# Patient Record
Sex: Female | Born: 2000 | Race: White | Hispanic: No | Marital: Single | Smoking: Never smoker
Health system: Southern US, Community
[De-identification: ages and names within clinical notes are randomized; demographics above are authoritative.]

## PROBLEM LIST (undated history)

## (undated) DIAGNOSIS — F419 Anxiety disorder, unspecified: Secondary | ICD-10-CM

## (undated) DIAGNOSIS — F909 Attention-deficit hyperactivity disorder, unspecified type: Secondary | ICD-10-CM

## (undated) DIAGNOSIS — K829 Disease of gallbladder, unspecified: Secondary | ICD-10-CM

## (undated) DIAGNOSIS — M779 Enthesopathy, unspecified: Secondary | ICD-10-CM

## (undated) DIAGNOSIS — M222X9 Patellofemoral disorders, unspecified knee: Secondary | ICD-10-CM

## (undated) DIAGNOSIS — F32A Depression, unspecified: Secondary | ICD-10-CM

## (undated) DIAGNOSIS — K219 Gastro-esophageal reflux disease without esophagitis: Secondary | ICD-10-CM

## (undated) HISTORY — DX: Attention-deficit hyperactivity disorder, unspecified type: F90.9

## (undated) HISTORY — DX: Enthesopathy, unspecified: M77.9

## (undated) HISTORY — DX: Anxiety disorder, unspecified: F41.9

## (undated) HISTORY — DX: Gastro-esophageal reflux disease without esophagitis: K21.9

## (undated) HISTORY — PX: GALLBLADDER SURGERY: SHX652

## (undated) HISTORY — DX: Patellofemoral disorders, unspecified knee: M22.2X9

## (undated) HISTORY — DX: Depression, unspecified: F32.A

## (undated) HISTORY — DX: Disease of gallbladder, unspecified: K82.9

---
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
## 2018-05-11 IMAGING — US US ABDOMEN LIMITED
1 series · 14 of 25 positions shown · non-contrast
Comparison: 05/26/2012

CLINICAL DATA: Weight loss, RIGHT upper quadrant pain for 5 days

EXAM:
US ABDOMEN LIMITED - RIGHT UPPER QUADRANT

[Series 1: us abdomen limited · 0.26mm/px · 14 of 53 slices shown]
[im 1/53]
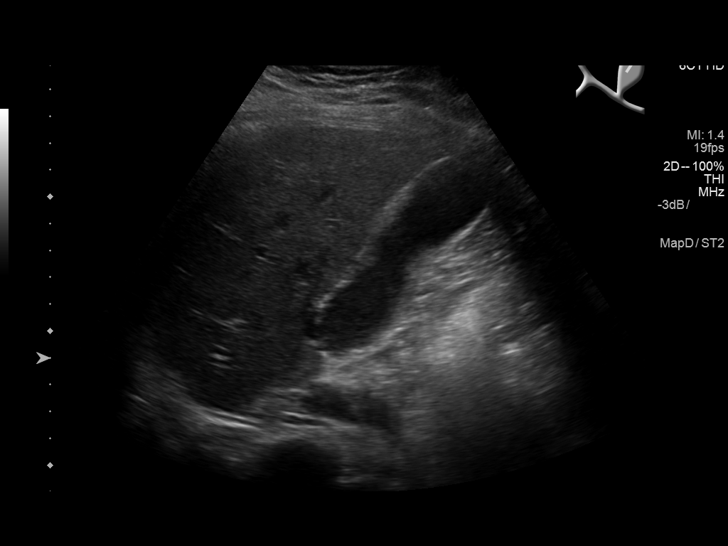
[im 5/53]
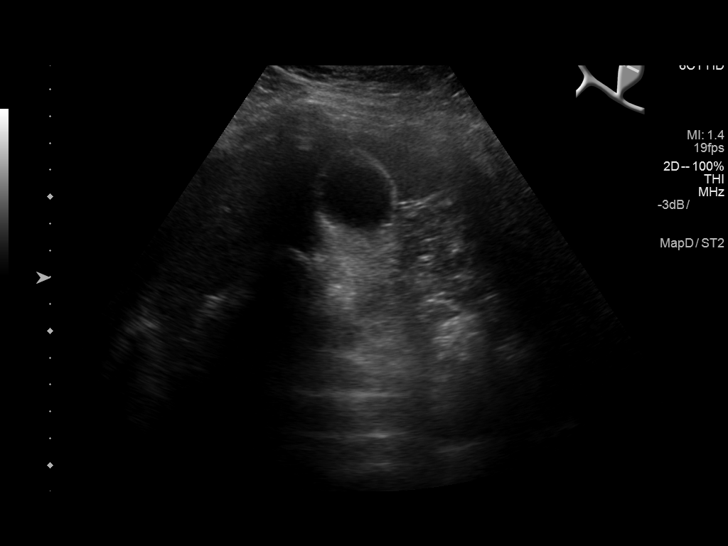
[im 9/53]
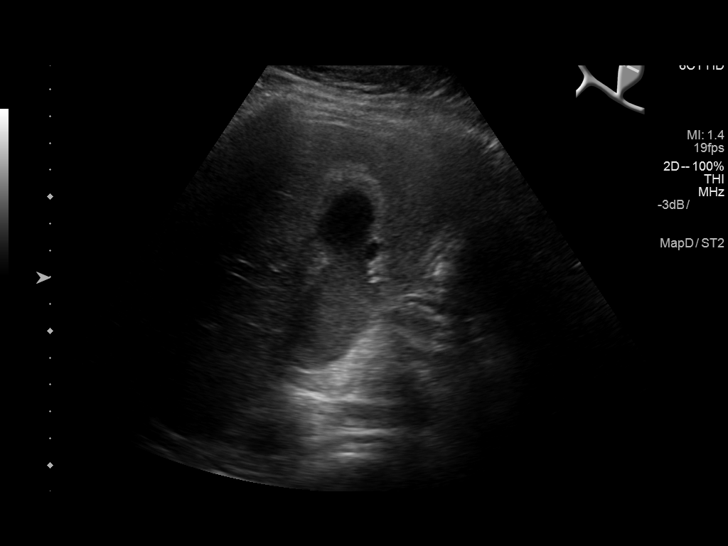
[im 14/53]
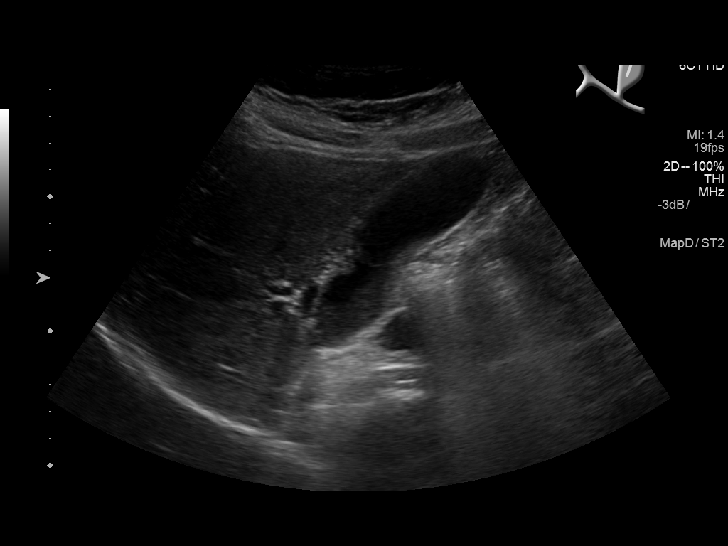
[im 18/53]
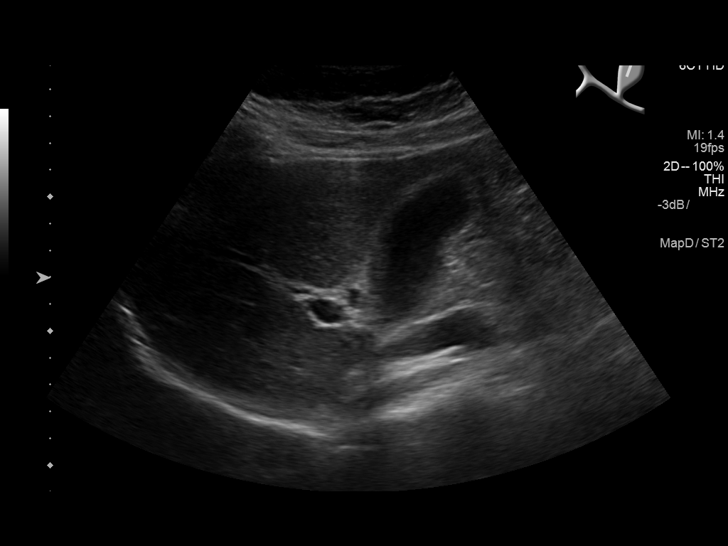
[im 20/53]
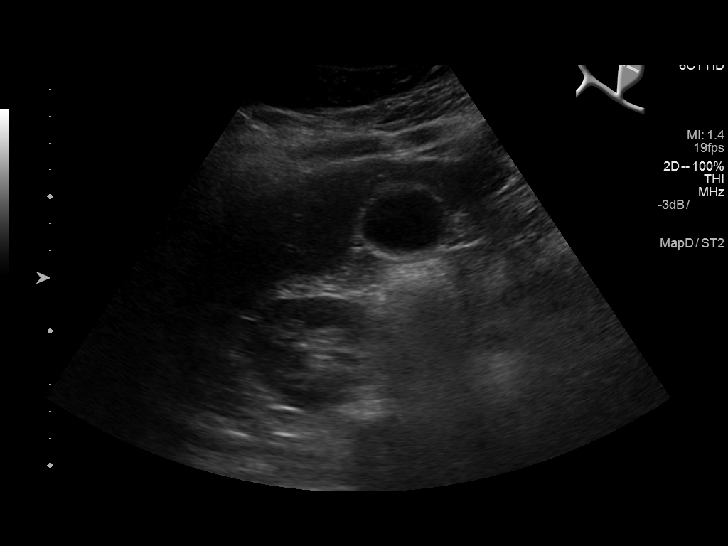
[im 24/53]
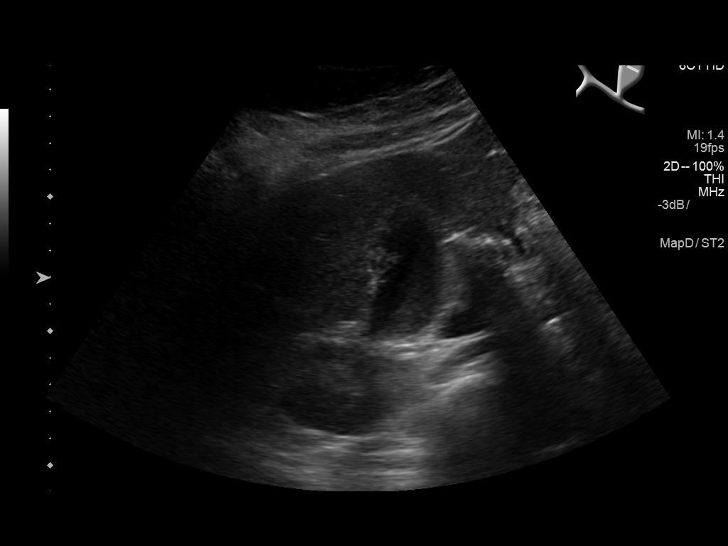
[im 29/53]
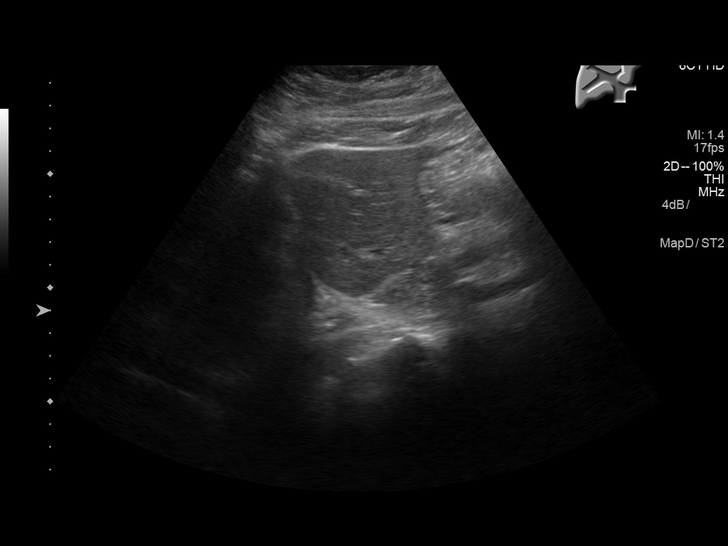
[im 33/53]
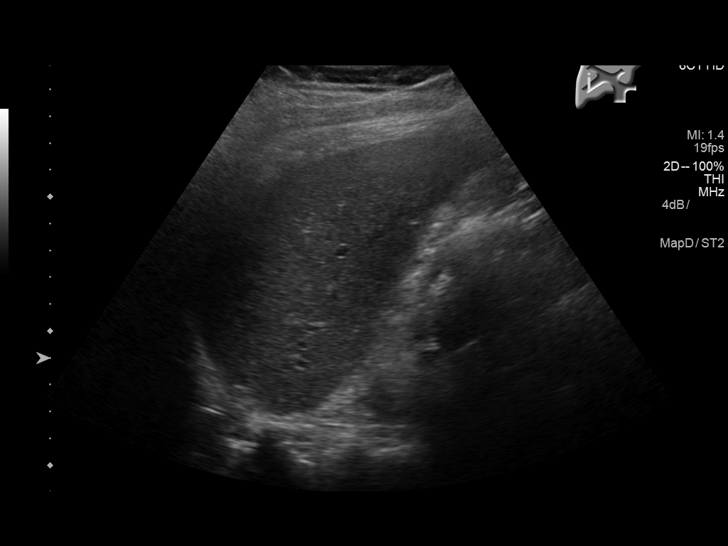
[im 35/53]
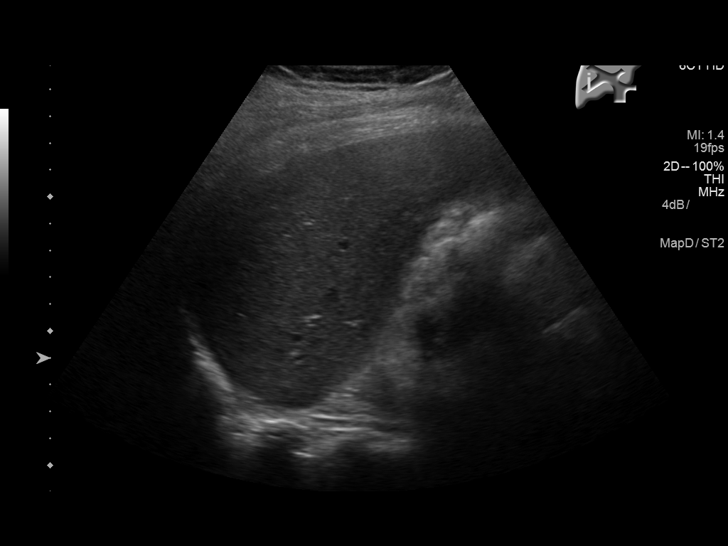
[im 40/53]
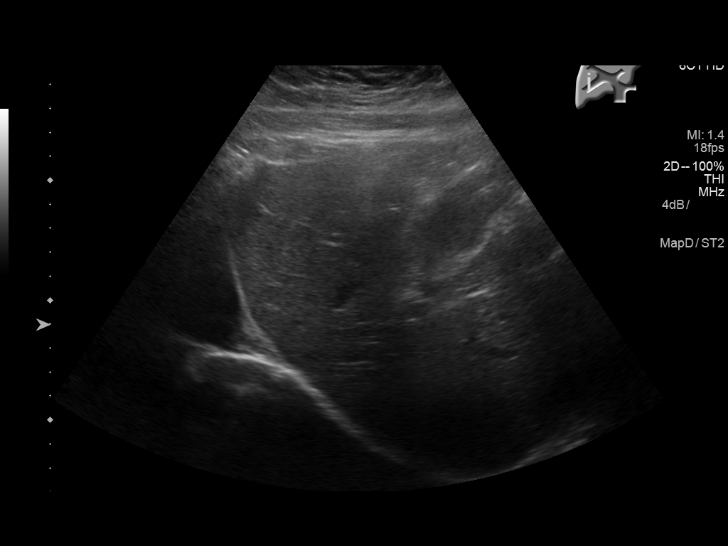
[im 44/53]
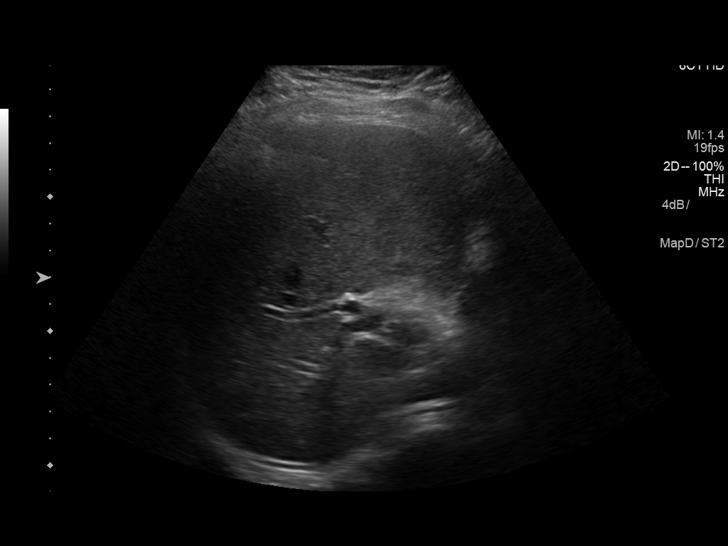
[im 48/53]
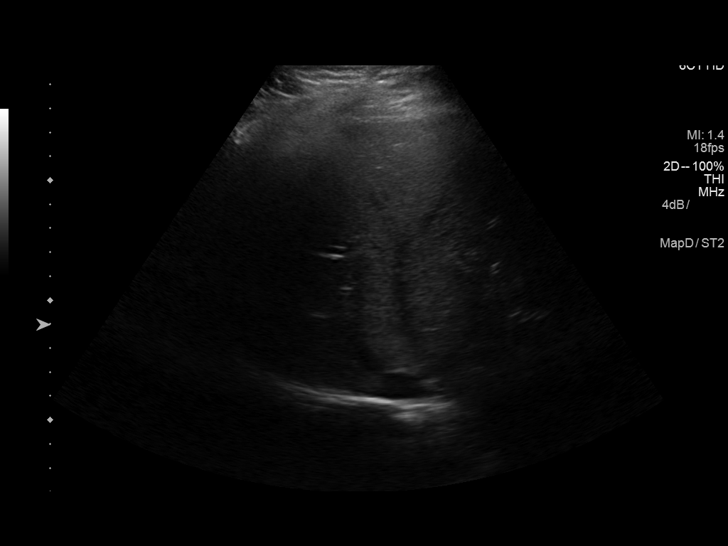
[im 53/53]
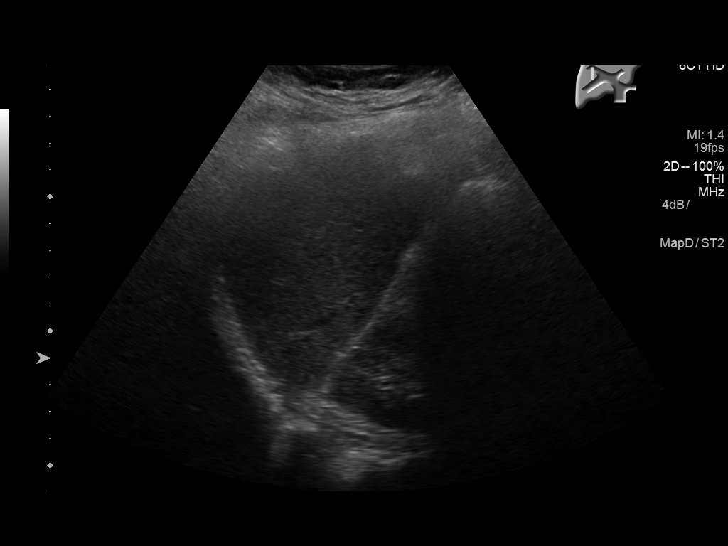

[14 of 25 positions shown; findings below may reference images not displayed]

FINDINGS: Gallbladder:

Dependent sludge within gallbladder. No shadowing gallstones. Mild
gallbladder wall thickening present. No pericholecystic fluid or
sonographic Murphy sign.

Common bile duct:

Diameter: 3 mm diameter , normal

Liver:

Normal appearance.  Hepatopetal portal venous flow.

No RIGHT upper quadrant free fluid
IMPRESSION: Sludge within gallbladder with mild nonspecific gallbladder wall
thickening.

No definite evidence of gallstones or biliary dilatation.

## 2018-12-04 ENCOUNTER — Telehealth: Payer: Self-pay | Admitting: Obstetrics and Gynecology

## 2018-12-04 NOTE — Telephone Encounter (Signed)
Patient is schedule for nexplanon replacement on 12/16/18 with [facility]

## 2018-12-07 NOTE — Telephone Encounter (Signed)
Noted. Will order to arrive by apt date/time. 

## 2018-12-16 ENCOUNTER — Encounter: Payer: Self-pay | Admitting: Obstetrics and Gynecology

## 2018-12-16 ENCOUNTER — Other Ambulatory Visit: Payer: Self-pay

## 2018-12-16 ENCOUNTER — Other Ambulatory Visit (HOSPITAL_COMMUNITY)
Admission: RE | Admit: 2018-12-16 | Discharge: 2018-12-16 | Disposition: A | Discharge status: Home / Self Care | Payer: Managed Care, Other (non HMO) | Source: Ambulatory Visit | Attending: Obstetrics and Gynecology | Admitting: Obstetrics and Gynecology

## 2018-12-16 ENCOUNTER — Ambulatory Visit (INDEPENDENT_AMBULATORY_CARE_PROVIDER_SITE_OTHER): Payer: Managed Care, Other (non HMO) | Admitting: Obstetrics and Gynecology

## 2018-12-16 VITALS — BP 124/80 | Ht 65.0 in | Wt 183.0 lb

## 2018-12-16 DIAGNOSIS — Z30017 Encounter for initial prescription of implantable subdermal contraceptive: Secondary | ICD-10-CM

## 2018-12-16 DIAGNOSIS — Z01419 Encounter for gynecological examination (general) (routine) without abnormal findings: Secondary | ICD-10-CM

## 2018-12-16 DIAGNOSIS — Z0000 Encounter for general adult medical examination without abnormal findings: Secondary | ICD-10-CM

## 2018-12-16 DIAGNOSIS — Z3046 Encounter for surveillance of implantable subdermal contraceptive: Secondary | ICD-10-CM

## 2018-12-16 DIAGNOSIS — Z113 Encounter for screening for infections with a predominantly sexual mode of transmission: Secondary | ICD-10-CM | POA: Diagnosis present

## 2018-12-16 NOTE — Progress Notes (Signed)
Gynecology Annual Exam  PCP: Pricilla HolmSharpe, Leslie M, MD  Chief Complaint:  Chief Complaint  Patient presents with  . Contraception    nexplanon replacement   . Annual Exam    History of Present Illness: Patient is a 18 y.o. G0P0000 presents for annual exam. The patient has no complaints today.   LMP: Patient's last menstrual period was 11/26/2018. Menarche:not applicable Average Interval: irregular, not applicable days Duration of flow: several days Heavy Menses: no Clots: no Intermenstrual Bleeding: no Postcoital Bleeding: no Dysmenorrhea: no  The patient is sexually active. She currently uses Nexplanon for contraception. She denies dyspareunia.  The patient does not perform self breast exams.  There is no notable family history of breast or ovarian cancer in her family.  The patient wears seatbelts: yes.  The patient has regular exercise: yes.    The patient denies current symptoms of depression.    Review of Systems: Review of Systems  Constitutional: Negative for chills, fever, malaise/fatigue and weight loss.  HENT: Negative for congestion, hearing loss and sinus pain.   Eyes: Negative for blurred vision and double vision.  Respiratory: Negative for cough, sputum production, shortness of breath and wheezing.   Cardiovascular: Negative for chest pain, palpitations, orthopnea and leg swelling.  Gastrointestinal: Negative for abdominal pain, constipation, diarrhea, nausea and vomiting.  Genitourinary: Negative for dysuria, flank pain, frequency, hematuria and urgency.  Musculoskeletal: Negative for back pain, falls and joint pain.  Skin: Negative for itching and rash.  Neurological: Negative for dizziness and headaches.  Psychiatric/Behavioral: Negative for depression, substance abuse and suicidal ideas. The patient is not nervous/anxious.     Past Medical History:  Past Medical History:  Diagnosis Date  . Tendonitis     Past Surgical History:  Past Surgical  History:  Procedure Laterality Date  . GALLBLADDER SURGERY      Gynecologic History:  Patient's last menstrual period was 11/26/2018. Contraception: Nexplanon Last Pap: Under 21  Obstetric History: G0P0000  Family History:  Family History  Adopted: Yes    Social History:  Social History   Socioeconomic History  . Marital status: Single    Spouse name: Not on file  . Number of children: Not on file  . Years of education: Not on file  . Highest education level: Not on file  Occupational History  . Not on file  Social Needs  . Financial resource strain: Not on file  . Food insecurity    Worry: Not on file    Inability: Not on file  . Transportation needs    Medical: Not on file    Non-medical: Not on file  Tobacco Use  . Smoking status: Never Smoker  . Smokeless tobacco: Never Used  Substance and Sexual Activity  . Alcohol use: Never    Frequency: Never  . Drug use: Never  . Sexual activity: Yes    Birth control/protection: Implant  Lifestyle  . Physical activity    Days per week: Not on file    Minutes per session: Not on file  . Stress: Not on file  Relationships  . Social Musicianconnections    Talks on phone: Not on file    Gets together: Not on file    Attends religious service: Not on file    Active member of club or organization: Not on file    Attends meetings of clubs or organizations: Not on file    Relationship status: Not on file  . Intimate partner violence  Fear of current or ex partner: Not on file    Emotionally abused: Not on file    Physically abused: Not on file    Forced sexual activity: Not on file  Other Topics Concern  . Not on file  Social History Narrative  . Not on file    Allergies:  No Known Allergies  Medications: Prior to Admission medications   Medication Sig Start Date End Date Taking? Authorizing Provider  etonogestrel (NEXPLANON) 68 MG IMPL implant 68 mg.   Yes [provider]  meloxicam (MOBIC) 15 MG tablet  Take 15 mg by mouth daily.   Yes [provider]    Physical Exam Vitals: Blood pressure 124/80, height 5\' 5"  (1.651 m), weight 183 lb (83 kg), last menstrual period 11/26/2018.  General: NAD HEENT: normocephalic, anicteric Thyroid: no enlargement, no palpable nodules Pulmonary: No increased work of breathing, CTAB Cardiovascular: RRR, distal pulses 2+ Breast: DECLINED Abdomen: NABS, soft, non-tender, non-distended.  Umbilicus without lesions.  No hepatomegaly, splenomegaly or masses palpable. No evidence of hernia  Genitourinary: DECLINED Extremities: no edema, erythema, or tenderness Neurologic: Grossly intact Psychiatric: mood appropriate, affect full  Female chaperone present for pelvic and breast  portions of the physical exam   Nexplanon removal and insertion Procedure note - The Nexplanon was noted in the patient's arm and the end was identified. The skin was cleansed with a Betadine solution. A small injection of subcutaneous lidocaine with epinephrine 3 cc was given over the end of the implant. An incision was made at the end of the implant. The rod was noted in the incision and grasped with a hemostat. It was noted to be intact.  Steri-Strip was placed approximating the incision. Hemostasis was noted.  Patient given informed consent, signed copy in the chart, time out was performed.Appropriate time out taken.  Patient's right arm was prepped and draped in the usual sterile fashion.. The ruler used to measure and mark insertion area.  Pt was prepped with betadine swab and then injected with 3 cc of 2% lidocaine with epinephrine. Nexplanon removed form packaging,  Device confirmed in needle, then inserted full length of needle and withdrawn per handbook instructions.  Pt insertion site covered with steri-strip and a bandage.   Minimal blood loss.  Pt tolerated the procedure well.    Assessment: 18 y.o. G0P0000 routine annual exam  Plan: Problem List Items Addressed This  Visit    None    Visit Diagnoses    Screening examination for STD (sexually transmitted disease)    -  Primary   Relevant Orders   Urine cytology ancillary only   Health care maintenance       Encounter for Nexplanon removal       Nexplanon insertion          1) 4) Gardasil Series discussed and if applicable offered to patient - Patient has previously completed 3 shot series   2) STI screening  was offered and accepted  3)  ASCCP guidelines and rational discussed.  Patient opts for initiating at 21 screening interval  4) Contraception - the patient is currently using  Nexplanon.  She is happy with her current form of contraception and plans to continue. Nexplanon removed and replaced today.  We discussed safe sex practices to reduce her furture risk of STI's.    5) Return in about 1 year (around 12/16/2019).  Adrian Prows MD [facility], [facility] 12/16/2018 11:21 AM

## 2018-12-18 LAB — URINE CYTOLOGY ANCILLARY ONLY
Chlamydia: NEGATIVE
Neisseria Gonorrhea: NEGATIVE
Trichomonas: NEGATIVE

## 2019-07-26 NOTE — Telephone Encounter (Signed)
Nexplanon provided for this patient. Nexplanon rcvd/charged 12/16/2018

## 2021-02-06 ENCOUNTER — Other Ambulatory Visit: Payer: Self-pay

## 2021-02-06 NOTE — Progress Notes (Deleted)
   New Patient Office Visit  Subjective:  Patient ID: Bailey Serrano, female    DOB: 05-Sep-2000  Age: 20 y.o. MRN: [id number]  CC: No chief complaint on file.   HPI Bailey Serrano presents for ***  Past Medical History:  Diagnosis Date   Tendonitis     Past Surgical History:  Procedure Laterality Date   GALLBLADDER SURGERY      Family History  Adopted: Yes    Social History   Socioeconomic History   Marital status: Single    Spouse name: Not on file   Number of children: Not on file   Years of education: Not on file   Highest education level: Not on file  Occupational History   Not on file  Tobacco Use   Smoking status: Never   Smokeless tobacco: Never  Vaping Use   Vaping Use: Every day  Substance and Sexual Activity   Alcohol use: Never   Drug use: Never   Sexual activity: Yes    Birth control/protection: Implant  Other Topics Concern   Not on file  Social History Narrative   Not on file   Social Determinants of Health   Financial Resource Strain: Not on file  Food Insecurity: Not on file  Transportation Needs: Not on file  Physical Activity: Not on file  Stress: Not on file  Social Connections: Not on file  Intimate Partner Violence: Not on file    ROS Review of Systems  Constitutional:  Negative for chills, fatigue and fever.  HENT:  Negative for congestion, ear pain, rhinorrhea and sore throat.   Respiratory:  Negative for cough and shortness of breath.   Cardiovascular:  Negative for chest pain.  Gastrointestinal:  Negative for abdominal pain, constipation, diarrhea, nausea and vomiting.  Genitourinary:  Negative for dysuria and urgency.  Musculoskeletal:  Negative for back pain and myalgias.  Neurological:  Negative for dizziness, weakness, light-headedness and headaches.  Psychiatric/Behavioral:  Negative for dysphoric mood. The patient is not nervous/anxious.    Objective:   Today's Vitals: There were no vitals taken for this  visit.  Physical Exam Vitals reviewed.  Constitutional:      Appearance: Normal appearance. She is normal weight.  Neck:     Vascular: No carotid bruit.  Cardiovascular:     Rate and Rhythm: Normal rate and regular rhythm.     Pulses: Normal pulses.     Heart sounds: Normal heart sounds.  Pulmonary:     Effort: Pulmonary effort is normal. No respiratory distress.     Breath sounds: Normal breath sounds.  Abdominal:     General: Abdomen is flat. Bowel sounds are normal.     Palpations: Abdomen is soft.     Tenderness: There is no abdominal tenderness.  Neurological:     Mental Status: She is alert and oriented to person, place, and time.  Psychiatric:        Mood and Affect: Mood normal.        Behavior: Behavior normal.    Assessment & Plan:   Problem List Items Addressed This Visit   None   Outpatient Encounter Medications as of 02/07/2021  Medication Sig   etonogestrel (NEXPLANON) 68 MG IMPL implant 68 mg.   meloxicam (MOBIC) 15 MG tablet Take 15 mg by mouth daily.   No facility-administered encounter medications on file as of 02/07/2021.    Follow-up: No follow-ups on file.   Horald Pollen, CMA

## 2021-02-07 ENCOUNTER — Encounter: Payer: Self-pay | Admitting: Nurse Practitioner

## 2021-02-07 ENCOUNTER — Ambulatory Visit: Payer: Managed Care, Other (non HMO) | Admitting: Nurse Practitioner

## 2021-02-07 VITALS — BP 112/62 | HR 109 | Temp 97.5°F | Ht 65.5 in | Wt 206.0 lb

## 2021-02-07 DIAGNOSIS — R000 Tachycardia, unspecified: Secondary | ICD-10-CM

## 2021-02-07 DIAGNOSIS — Z021 Encounter for pre-employment examination: Secondary | ICD-10-CM | POA: Diagnosis not present

## 2021-02-07 DIAGNOSIS — Z0000 Encounter for general adult medical examination without abnormal findings: Secondary | ICD-10-CM

## 2021-02-07 NOTE — Patient Instructions (Addendum)
Return for weight management appointment We will call you with lab results    Preventive Care 74-22 Years Old, Female Preventive care refers to lifestyle choices and visits with your health care provider that can promote health and wellness. This includes: A yearly physical exam. This is also called an annual wellness visit. Regular dental and eye exams. Immunizations. Screening for certain conditions. Healthy lifestyle choices, such as: Eating a healthy diet. Getting regular exercise. Not using drugs or products that contain nicotine and tobacco. Limiting alcohol use. What can I expect for my preventive care visit? Physical exam Your health care provider may check your: Height and weight. These may be used to calculate your BMI (body mass index). BMI is a measurement that tells if you are at a healthy weight. Heart rate and blood pressure. Body temperature. Skin for abnormal spots. Counseling Your health care provider may ask you questions about your: Past medical problems. Family's medical history. Alcohol, tobacco, and drug use. Emotional well-being. Home life and relationship well-being. Sexual activity. Diet, exercise, and sleep habits. Work and work Statistician. Access to firearms. Method of birth control. Menstrual cycle. Pregnancy history. What immunizations do I need? Vaccines are usually given at various ages, according to a schedule. Your health care provider will recommend vaccines for you based on your age, medical history, and lifestyle or other factors, such as travel or where you work. What tests do I need? Blood tests Lipid and cholesterol levels. These may be checked every 5 years starting at age 28. Hepatitis C test. Hepatitis B test. Screening Diabetes screening. This is done by checking your blood sugar (glucose) after you have not eaten for a while (fasting). STD (sexually transmitted disease) testing, if you are at risk. BRCA-related cancer  screening. This may be done if you have a family history of breast, ovarian, tubal, or peritoneal cancers. Pelvic exam and Pap test. This may be done every 3 years starting at age 68. Starting at age 24, this may be done every 5 years if you have a Pap test in combination with an HPV test. Talk with your health care provider about your test results, treatment options, and if necessary, the need for more tests. Follow these instructions at home: Eating and drinking  Eat a healthy diet that includes fresh fruits and vegetables, whole grains, lean protein, and low-fat dairy products. Take vitamin and mineral supplements as recommended by your health care provider. Do not drink alcohol if: Your health care provider tells you not to drink. You are pregnant, may be pregnant, or are planning to become pregnant. If you drink alcohol: Limit how much you have to 0-1 drink a day. Be aware of how much alcohol is in your drink. In the [place] one drink equals one 12 oz bottle of beer (355 mL), one 5 oz glass of wine (148 mL), or one 1 oz glass of hard liquor (44 mL). Lifestyle Take daily care of your teeth and gums. Brush your teeth every morning and night with fluoride toothpaste. Floss one time each day. Stay active. Exercise for at least 30 minutes 5 or more days each week. Do not use any products that contain nicotine or tobacco, such as cigarettes, e-cigarettes, and chewing tobacco. If you need help quitting, ask your health care provider. Do not use drugs. If you are sexually active, practice safe sex. Use a condom or other form of protection to prevent STIs (sexually transmitted infections). If you do not wish to become pregnant, use a  form of birth control. If you plan to become pregnant, see your health care provider for a prepregnancy visit. Find healthy ways to cope with stress, such as: Meditation, yoga, or listening to music. Journaling. Talking to a trusted person. Spending time with friends  and family. Safety Always wear your seat belt while driving or riding in a vehicle. Do not drive: If you have been drinking alcohol. Do not ride with someone who has been drinking. When you are tired or distracted. While texting. Wear a helmet and other protective equipment during sports activities. If you have firearms in your house, make sure you follow all gun safety procedures. Seek help if you have been physically or sexually abused. What's next? Go to your health care provider once a year for an annual wellness visit. Ask your health care provider how often you should have your eyes and teeth checked. Stay up to date on all vaccines. This information is not intended to replace advice given to you by your health care provider. Make sure you discuss any questions you have with your health care provider. Document Revised: 06/30/2020 Document Reviewed: 01/01/2018 Elsevier Patient Education  [place] Maintenance, Female Adopting a healthy lifestyle and getting preventive care are important in promoting health and wellness. Ask your health care provider about: The right schedule for you to have regular tests and exams. Things you can do on your own to prevent diseases and keep yourself healthy. What should I know about diet, weight, and exercise? Eat a healthy diet  Eat a diet that includes plenty of vegetables, fruits, low-fat dairy products, and lean protein. Do not eat a lot of foods that are high in solid fats, added sugars, or sodium. Maintain a healthy weight Body mass index (BMI) is used to identify weight problems. It estimates body fat based on height and weight. Your health care provider can help determine your BMI and help you achieve or maintain a healthy weight. Get regular exercise Get regular exercise. This is one of the most important things you can do for your health. Most adults should: Exercise for at least 150 minutes each week. The exercise should  increase your heart rate and make you sweat (moderate-intensity exercise). Do strengthening exercises at least twice a week. This is in addition to the moderate-intensity exercise. Spend less time sitting. Even light physical activity can be beneficial. Watch cholesterol and blood lipids Have your blood tested for lipids and cholesterol at 22 years of age, then have this test every 5 years. Have your cholesterol levels checked more often if: Your lipid or cholesterol levels are high. You are older than 22 years of age. You are at high risk for heart disease. What should I know about cancer screening? Depending on your health history and family history, you may need to have cancer screening at various ages. This may include screening for: Breast cancer. Cervical cancer. Colorectal cancer. Skin cancer. Lung cancer. What should I know about heart disease, diabetes, and high blood pressure? Blood pressure and heart disease High blood pressure causes heart disease and increases the risk of stroke. This is more likely to develop in people who have high blood pressure readings, are of African descent, or are overweight. Have your blood pressure checked: Every 3-5 years if you are 88-86 years of age. Every year if you are 34 years old or older. Diabetes Have regular diabetes screenings. This checks your fasting blood sugar level. Have the screening done: Once every three years  after age 37 if you are at a normal weight and have a low risk for diabetes. More often and at a younger age if you are overweight or have a high risk for diabetes. What should I know about preventing infection? Hepatitis B If you have a higher risk for hepatitis B, you should be screened for this virus. Talk with your health care provider to find out if you are at risk for hepatitis B infection. Hepatitis C Testing is recommended for: Everyone born from [birth year] through 1965. Anyone with known risk factors for hepatitis  C. Sexually transmitted infections (STIs) Get screened for STIs, including gonorrhea and chlamydia, if: You are sexually active and are younger than 22 years of age. You are older than 22 years of age and your health care provider tells you that you are at risk for this type of infection. Your sexual activity has changed since you were last screened, and you are at increased risk for chlamydia or gonorrhea. Ask your health care provider if you are at risk. Ask your health care provider about whether you are at high risk for HIV. Your health care provider may recommend a prescription medicine to help prevent HIV infection. If you choose to take medicine to prevent HIV, you should first get tested for HIV. You should then be tested every 3 months for as long as you are taking the medicine. Pregnancy If you are about to stop having your period (premenopausal) and you may become pregnant, seek counseling before you get pregnant. Take 400 to 800 micrograms (mcg) of folic acid every day if you become pregnant. Ask for birth control (contraception) if you want to prevent pregnancy. Osteoporosis and menopause Osteoporosis is a disease in which the bones lose minerals and strength with aging. This can result in bone fractures. If you are 87 years old or older, or if you are at risk for osteoporosis and fractures, ask your health care provider if you should: Be screened for bone loss. Take a calcium or vitamin D supplement to lower your risk of fractures. Be given hormone replacement therapy (HRT) to treat symptoms of menopause. Follow these instructions at home: Lifestyle Do not use any products that contain nicotine or tobacco, such as cigarettes, e-cigarettes, and chewing tobacco. If you need help quitting, ask your health care provider. Do not use street drugs. Do not share needles. Ask your health care provider for help if you need support or information about quitting drugs. Alcohol use Do not  drink alcohol if: Your health care provider tells you not to drink. You are pregnant, may be pregnant, or are planning to become pregnant. If you drink alcohol: Limit how much you use to 0-1 drink a day. Limit intake if you are breastfeeding. Be aware of how much alcohol is in your drink. In the [place] one drink equals one 12 oz bottle of beer (355 mL), one 5 oz glass of wine (148 mL), or one 1 oz glass of hard liquor (44 mL). General instructions Schedule regular health, dental, and eye exams. Stay current with your vaccines. Tell your health care provider if: You often feel depressed. You have ever been abused or do not feel safe at home. Summary Adopting a healthy lifestyle and getting preventive care are important in promoting health and wellness. Follow your health care provider's instructions about healthy diet, exercising, and getting tested or screened for diseases. Follow your health care provider's instructions on monitoring your cholesterol and blood pressure. This information is  not intended to replace advice given to you by your health care provider. Make sure you discuss any questions you have with your health care provider. Document Revised: 06/30/2020 Document Reviewed: 04/15/2018 Elsevier Patient Education  2022 [employer].

## 2021-02-07 NOTE — Progress Notes (Signed)
Subjective:  Patient ID: Bailey Serrano, female    DOB: Feb 05, 2001  Age: 20 y.o. 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 This is her initial visit to the office. She is accompanied by her significant other.   Well Adult Physical: Patient here for a comprehensive physical exam.The patient reports no problems Do you take any herbs or supplements that were not prescribed by a doctor? no Are you taking calcium supplements? no Are you taking aspirin daily? no  Encounter for general adult medical examination without abnormal findings  Physical ("At Risk" items are starred): Patient's last physical exam was 1 year ago .  Patient is not afflicted from Stress Incontinence and Urge Incontinence  Patient wears a seat belt, has smoke detectors, has carbon monoxide detectors, practices appropriate gun safety, and wears sunscreen with extended sun exposure. Dental Care: biannual cleanings, brushes and flosses daily. Ophthalmology/Optometry: Does not have annual visit.  Hearing loss: none Vision impairments: none  Menarche: 20 y.o Menstrual History: irregular LMP: 02/04/2021 Pregnancy history: No Safe at home: yes Self breast exams: yes  Flowsheet Row Office Visit from 02/07/2021 in [facility]  PHQ-2 Total Score 0               Social Hx   Social History   Socioeconomic History   Marital status: Single    Spouse name: Not on file   Number of children: Not on file   Years of education: Not on file   Highest education level: Not on file  Occupational History   Not on file  Tobacco Use   Smoking status: Never   Smokeless tobacco: Never  Vaping Use   Vaping Use: Every day  Substance and Sexual Activity   Alcohol use: Never   Drug use: Never   Sexual activity: Yes    Birth  control/protection: Implant  Other Topics Concern   Not on file  Social History Narrative   Not on file   Social Determinants of Health   Financial Resource Strain: Not on file  Food Insecurity: Not on file  Transportation Needs: Not on file  Physical Activity: Not on file  Stress: Not on file  Social Connections: Not on file   Past Medical History:  Diagnosis Date   Tendonitis    Past Surgical History:  Procedure Laterality Date   GALLBLADDER SURGERY      Family History  Adopted: Yes    Review of Systems  Constitutional:  Negative for appetite change, fatigue and unexpected weight change.  HENT:  Negative for congestion, ear pain, rhinorrhea, sinus pressure, sinus pain and tinnitus.   Eyes:  Negative for pain.  Respiratory:  Negative for cough and shortness of breath.   Cardiovascular:  Negative for chest pain, palpitations and leg swelling.  Gastrointestinal:  Negative for abdominal pain, constipation, diarrhea, nausea and vomiting.  Endocrine: Negative for cold intolerance, heat intolerance, polydipsia, polyphagia and polyuria.  Genitourinary:  Negative for dysuria, frequency and hematuria.  Musculoskeletal:  Negative for arthralgias, back pain, joint swelling and myalgias.  Skin:  Negative for rash.  Allergic/Immunologic: Negative for environmental allergies.  Neurological:  Negative for dizziness and headaches.  Hematological:  Negative for adenopathy.  Psychiatric/Behavioral:  Negative for decreased concentration and sleep disturbance. The patient is not nervous/anxious.  Objective:  Ht 5' 5.5" (1.664 m)   Wt 206 lb (93.4 kg)   BMI 33.76 kg/m   BP/Weight 02/07/2021 12/16/2018 12/15/2017  Systolic BP - 124 110  Diastolic BP - 80 70  Wt. (Lbs) 206 183 183  BMI 33.76 30.45 29.99  BP 112/62   Pulse (!) 109   Temp (!) 97.5 F (36.4 C)   Ht 5' 5.5" (1.664 m)   Wt 206 lb (93.4 kg)   LMP 02/04/2021   SpO2 100%   BMI 33.76 kg/m    Physical Exam Vitals  reviewed.  Constitutional:      Appearance: Normal appearance.  HENT:     Head: Normocephalic.     Right Ear: Tympanic membrane normal.     Left Ear: Tympanic membrane normal.     Nose: Nose normal.     Mouth/Throat:     Mouth: Mucous membranes are moist.  Eyes:     Pupils: Pupils are equal, round, and reactive to light.  Cardiovascular:     Rate and Rhythm: Regular rhythm. Tachycardia present.     Pulses: Normal pulses.     Heart sounds: Normal heart sounds.  Pulmonary:     Effort: Pulmonary effort is normal.     Breath sounds: Normal breath sounds.  Abdominal:     General: Bowel sounds are normal.     Palpations: Abdomen is soft.  Musculoskeletal:        General: Normal range of motion.     Cervical back: Neck supple.  Skin:    General: Skin is warm and dry.     Capillary Refill: Capillary refill takes less than 2 seconds.  Neurological:     General: No focal deficit present.     Mental Status: She is alert and oriented to person, place, and time.  Psychiatric:        Mood and Affect: Mood normal.        Behavior: Behavior normal.    Lab Results  Component Value Date   WBC 18.6 (H) 05/26/2012   HGB 15.6 05/26/2012   HCT 45.5 05/26/2012   PLT 265 05/26/2012   GLUCOSE 177 (H) 05/26/2012   ALT 22 05/26/2012   AST 24 05/26/2012   NA 141 05/26/2012   K 3.5 05/26/2012   CL 106 05/26/2012   CREATININE 0.80 05/26/2012   BUN 15 05/26/2012   CO2 27 (H) 05/26/2012      Assessment & Plan:   1. Encounter for pre-employment health screening examination - CBC with Differential/Platelet - Comprehensive metabolic panel - TSH  2. Tachycardia - CBC with Differential/Platelet - Comprehensive metabolic panel - TSH     Body mass index is 33.76 kg/m.   These are the goals we discussed:  Goals   Weight loss       This is a list of the screening recommended for you and due dates:  Health Maintenance  Topic Date Due   HPV Vaccine (1 - 2-dose series) Never done    HIV Screening  Never done   Hepatitis C Screening: USPSTF Recommendation to screen - Ages 5-79 yo.  Never done   Tetanus Vaccine  Never done   Chlamydia screening  12/16/2019   Pap Smear  Never done   Pap Smear  Never done   Flu Shot  Discontinued   COVID-19 Vaccine  Discontinued    Return for weight management appointment We will call you with lab results   Follow-up: 4-weeks, weight management   An  After Visit Summary was printed and given to the patient.  I, Janie Morning, NP, have reviewed all documentation for this visit. The documentation on 02/12/21 for the exam, diagnosis, procedures, and orders are all accurate and complete.   Janie Morning, NP [facility] [phone]

## 2021-02-08 LAB — COMPREHENSIVE METABOLIC PANEL
ALT: 20 IU/L (ref 0–32)
AST: 19 IU/L (ref 0–40)
Albumin/Globulin Ratio: 1.6 (ref 1.2–2.2)
Albumin: 4.5 g/dL (ref 3.9–5.0)
Alkaline Phosphatase: 77 IU/L (ref 44–121)
BUN/Creatinine Ratio: 11 (ref 9–23)
BUN: 8 mg/dL (ref 6–20)
Bilirubin Total: <0.2 mg/dL (ref 0.0–1.2)
CO2: 19 mmol/L — ABNORMAL LOW (ref 20–29)
Calcium: 9.9 mg/dL (ref 8.7–10.2)
Chloride: 104 mmol/L (ref 96–106)
Creatinine, Ser: 0.71 mg/dL (ref 0.57–1.00)
Globulin, Total: 2.9 g/dL (ref 1.5–4.5)
Glucose: 79 mg/dL (ref 70–99)
Potassium: 4.4 mmol/L (ref 3.5–5.2)
Sodium: 142 mmol/L (ref 134–144)
Total Protein: 7.4 g/dL (ref 6.0–8.5)
eGFR: 123 mL/min/{1.73_m2} (ref >59)

## 2021-02-08 LAB — CBC WITH DIFFERENTIAL/PLATELET
Basophils Absolute: 0.1 10*3/uL (ref 0.0–0.2)
Basos: 0 %
EOS (ABSOLUTE): 0 10*3/uL (ref 0.0–0.4)
Eos: 0 %
Hematocrit: 46.7 % — ABNORMAL HIGH (ref 34.0–46.6)
Hemoglobin: 15.5 g/dL (ref 11.1–15.9)
Immature Grans (Abs): 0.1 10*3/uL (ref 0.0–0.1)
Immature Granulocytes: 1 %
Lymphocytes Absolute: 2.7 10*3/uL (ref 0.7–3.1)
Lymphs: 24 %
MCH: 28.9 pg (ref 26.6–33.0)
MCHC: 33.2 g/dL (ref 31.5–35.7)
MCV: 87 fL (ref 79–97)
Monocytes Absolute: 0.6 10*3/uL (ref 0.1–0.9)
Monocytes: 5 %
Neutrophils Absolute: 7.9 10*3/uL — ABNORMAL HIGH (ref 1.4–7.0)
Neutrophils: 70 %
Platelets: 337 10*3/uL (ref 150–450)
RBC: 5.36 x10E6/uL — ABNORMAL HIGH (ref 3.77–5.28)
RDW: 13.1 % (ref 11.7–15.4)
WBC: 11.3 10*3/uL — ABNORMAL HIGH (ref 3.4–10.8)

## 2021-02-08 LAB — TSH: TSH: 1.55 u[IU]/mL (ref 0.450–4.500)

## 2021-03-06 NOTE — Progress Notes (Signed)
Subjective:  Patient ID: Bailey Serrano, female    DOB: 2001/01/15  Age: 20 y.o. MRN: [id number]  Chief Complaint  Patient presents with   Weight Loss Management    HPI  Bailey Serrano is a 20 year old Caucasian female that presents to discuss weight management. Current BMI 32.42. States she would like to lose weight to improve her overall health. She was adopted, does not know her family history of medical problems. She tells me that she gains weight easily and struggles to lose weight. States she eats a heart healthy diet and walks 2-3 miles daily. Drinks mostly water, an occasional Pharmacologist drink. She was an athlete in high school and college.She has difficulty doing aggressive cardio exercises due to sports injury to knee.  Current Outpatient Medications on File Prior to Visit  Medication Sig Dispense Refill   etonogestrel (NEXPLANON) 68 MG IMPL implant 68 mg.     No current facility-administered medications on file prior to visit.   Past Medical History:  Diagnosis Date   Patellofemoral syndrome    Tendonitis    Past Surgical History:  Procedure Laterality Date   GALLBLADDER SURGERY      Family History  Adopted: Yes  Problem Relation Age of Onset   Hypertension Mother    Muscular dystrophy Brother    COPD Maternal Grandmother    Diabetes Other    Aneurysm Other    Social History   Socioeconomic History   Marital status: Single    Spouse name: Not on file   Number of children: Not on file   Years of education: Not on file   Highest education level: Not on file  Occupational History   Not on file  Tobacco Use   Smoking status: Never   Smokeless tobacco: Never  Vaping Use   Vaping Use: Every day   Substances: Nicotine, Flavoring  Substance and Sexual Activity   Alcohol use: Never   Drug use: Never   Sexual activity: Yes    Birth control/protection: Implant  Other Topics Concern   Not on file  Social History Narrative   Not on file   Social  Determinants of Health   Financial Resource Strain: Not on file  Food Insecurity: Not on file  Transportation Needs: Not on file  Physical Activity: Not on file  Stress: Not on file  Social Connections: Not on file    Review of Systems  Constitutional:  Negative for chills, fatigue and fever.  HENT:  Negative for congestion, ear pain, rhinorrhea and sore throat.   Respiratory:  Negative for cough and shortness of breath.   Cardiovascular:  Negative for chest pain (Sometimes).  Gastrointestinal:  Negative for abdominal pain, constipation, diarrhea, nausea and vomiting.  Endocrine: Positive for polydipsia. Negative for polyphagia.  Genitourinary:  Negative for dysuria and urgency.  Musculoskeletal:  Negative for back pain and myalgias.  Neurological:  Positive for headaches. Negative for dizziness, weakness and light-headedness.  Psychiatric/Behavioral:  Negative for dysphoric mood. The patient is not nervous/anxious.     Objective:  BP 120/64   Pulse 86   Temp (!) 97.3 F (36.3 C)   Ht 5\' 7"  (1.702 m)   Wt 207 lb (93.9 kg)   SpO2 99%   BMI 32.42 kg/m    BP/Weight 02/07/2021 12/16/2018 12/15/2017  Systolic BP 112 124 110  Diastolic BP 62 80 70  Wt. (Lbs) 206 183 183  BMI 33.76 30.45 29.99    Physical Exam Vitals reviewed.  Constitutional:  Appearance: Normal appearance. She is normal weight.  Neck:     Vascular: No carotid bruit.  Cardiovascular:     Rate and Rhythm: Normal rate and regular rhythm.     Pulses: Normal pulses.     Heart sounds: Normal heart sounds.  Pulmonary:     Effort: Pulmonary effort is normal. No respiratory distress.     Breath sounds: Normal breath sounds.  Abdominal:     General: Abdomen is flat. Bowel sounds are normal.     Palpations: Abdomen is soft.     Tenderness: There is no abdominal tenderness.  Neurological:     Mental Status: She is alert and oriented to person, place, and time.  Psychiatric:        Mood and Affect: Mood  normal.        Behavior: Behavior normal.        Lab Results  Component Value Date   WBC 11.3 (H) 02/07/2021   HGB 15.5 02/07/2021   HCT 46.7 (H) 02/07/2021   PLT 337 02/07/2021   GLUCOSE 79 02/07/2021   ALT 20 02/07/2021   AST 19 02/07/2021   NA 142 02/07/2021   K 4.4 02/07/2021   CL 104 02/07/2021   CREATININE 0.71 02/07/2021   BUN 8 02/07/2021   CO2 19 (L) 02/07/2021   TSH 1.550 02/07/2021      Assessment & Plan:   .1. Encounter for weight management - phentermine (ADIPEX-P) 37.5 MG tablet; Take 1 tablet (37.5 mg total) by mouth daily before breakfast.  Dispense: 30 tablet; Refill: 0  2. BMI 32.0-32.9,adult - phentermine (ADIPEX-P) 37.5 MG tablet; Take 1 tablet (37.5 mg total) by mouth daily before breakfast.  Dispense: 30 tablet; Refill: 0  3. Obesity (BMI 30.0-34.9) - phentermine (ADIPEX-P) 37.5 MG tablet; Take 1 tablet (37.5 mg total) by mouth daily before breakfast.  Dispense: 30 tablet; Refill: 0    Begin Adipex 37.5 mg daily for weight management, start with 1/2 tablet for the first week and then increase to whole tablet Notify office if you have any adverse side effects Avoid caffeine while taking Adipex Eat small frequent meals with high fiber Follow-up in 4-weeks      Follow-up: 4-weeks  An After Visit Summary was printed and given to the patient.  I, Janie Morning, NP, have reviewed all documentation for this visit. The documentation on 03/07/21 for the exam, diagnosis, procedures, and orders are all accurate and complete.   Janie Morning, NP [facility] [phone]

## 2021-03-07 ENCOUNTER — Ambulatory Visit: Payer: Managed Care, Other (non HMO) | Admitting: Nurse Practitioner

## 2021-03-07 ENCOUNTER — Encounter: Payer: Self-pay | Admitting: Nurse Practitioner

## 2021-03-07 ENCOUNTER — Other Ambulatory Visit: Payer: Self-pay

## 2021-03-07 VITALS — BP 120/64 | HR 86 | Temp 97.3°F | Ht 67.0 in | Wt 207.0 lb

## 2021-03-07 DIAGNOSIS — E669 Obesity, unspecified: Secondary | ICD-10-CM

## 2021-03-07 DIAGNOSIS — Z7689 Persons encountering health services in other specified circumstances: Secondary | ICD-10-CM

## 2021-03-07 DIAGNOSIS — Z6832 Body mass index (BMI) 32.0-32.9, adult: Secondary | ICD-10-CM | POA: Diagnosis not present

## 2021-03-07 MED ORDER — PHENTERMINE HCL 37.5 MG PO TABS: 37.5000 mg | ORAL_TABLET | Freq: Every day | ORAL | 0 refills | Status: DC

## 2021-03-07 NOTE — Patient Instructions (Signed)
Begin Adipex 37.5 mg daily for weight management, start with 1/2 tablet for the first week and then increase to whole tablet Notify office if you have any adverse side effects Avoid caffeine while taking Adipex Eat small frequent meals with high fiber Follow-up in 4-weeks   How to Increase Your Level of Physical Activity Getting regular physical activity is important for your overall health and well-being. Most people do not get enough exercise. There are easy ways to increase your level of physical activity, even if you have not been very active in the past or if you are just starting out. What are the benefits of physical activity? Physical activity has many short-term and long-term benefits. Being active on a regular basis can improve your physical and mental health as well as provide other benefits. Physical health benefits Helping you lose weight or maintain a healthy weight. Strengthening your muscles and bones. Reducing your risk of certain long-term (chronic) diseases, including heart disease, cancer, and diabetes. Being able to move around more easily and for longer periods of time without getting tired (increased endurance or stamina). Improving your ability to fight off illness (enhanced immunity). Being able to sleep better. Helping you stay healthy as you get older, including: Helping you stay mobile, or capable of walking and moving around. Preventing accidents, such as falls. Increasing life expectancy. Mental health benefits Boosting your mood and improving your self-esteem. Lowering your chance of having mental health problems, such as depression or anxiety. Helping you feel good about your body. Other benefits Finding new sources of fun and enjoyment. Meeting new people who share a common interest. Before you begin If you have a chronic illness or have not been active for a while, check with your health care provider about how to get started. Ask your health care  provider what activities are safe for you. Start out slowly. Walking or doing some simple chair exercises is a good place to start, especially if you have not been active before or for a long time. Set goals that you can work toward. Ask your health care provider how much exercise is best for you. In general, most adults should: Do moderate-intensity exercise for at least 150 minutes each week (30 minutes on most days of the week) or vigorous exercise for at least 75 minutes each week, or a combination of these. Moderate-intensity exercise can include walking at a quick pace, biking, yoga, water aerobics, or gardening. Vigorous exercise involves activities that take more effort, such as jogging or running, playing sports, swimming laps, or jumping rope. Do strength exercises on at least 2 days each week. This can include weight lifting, body weight exercises, and resistance-band exercises. How to be more physically active Make a plan  Try to find activities that you enjoy. You are more likely to commit to an exercise routine if it does not feel like a chore. If you have bone or joint problems, choose low-impact exercises, like walking or swimming. Use these tips for being successful with an exercise plan: Find a workout partner for accountability. Join a group or class, such as an aerobics class, cycling class, or sports team. Make family time active. Go for a walk, bike, or swim. Include a variety of exercises each week. Consider using a fitness tracker, such as a mobile phone app or a device worn like a watch, that will count the number of steps you take each day. Many people strive to reach 10,000 steps a day. Find ways to be  active in your daily routines Besides your formal exercise plans, you can find ways to do physical activity during your daily routines, such as: Walking or biking to work or to the store. Taking the stairs instead of the elevator. Parking farther away from the door at  work or at the store. Planning walking meetings. Walking around while you are on the phone. Where to find more information Centers for Disease Control and Prevention: CampusCasting.com.pt President's Council on Fitness, Sports & Nutrition: www.fitness.gov ChooseMyPlate: http://www.harvey.com/ Contact a health care provider if: You have headaches, muscle aches, or joint pain that is concerning. You feel dizzy or light-headed while exercising. You faint. You feel your heart skipping, racing, or fluttering. You have chest pain while exercising. Summary Exercise benefits your mind and body at any age, even if you are just starting out. If you have a chronic illness or have not been active for a while, check with your health care provider before increasing your physical activity. Choose activities that are safe and enjoyable for you. Ask your health care provider what activities are safe for you. Start slowly. Tell your health care provider if you have problems as you start to increase your activity level. This information is not intended to replace advice given to you by your health care provider. Make sure you discuss any questions you have with your health care provider. Document Revised: 08/18/2020 Document Reviewed: 08/18/2020 Elsevier Patient Education  2022 [employer] Calorie Counting for [employer] Loss Calories are units of energy. Your body needs a certain number of calories from food to keep going throughout the day. When you eat or drink more calories than your body needs, your body stores the extra calories mostly as fat. When you eat or drink fewer calories than your body needs, your body burns fat to get the energy it needs. Calorie counting means keeping track of how many calories you eat and drink each day. Calorie counting can be helpful if you need to lose weight. If you eat fewer calories than your body needs, you should lose weight. Ask your health care provider what a healthy  weight is for you. For calorie counting to work, you will need to eat the right number of calories each day to lose a healthy amount of weight per week. A dietitian can help you figure out how many calories you need in a day and will suggest ways to reach your calorie goal. A healthy amount of weight to lose each week is usually 1-2 lb (0.5-0.9 kg). This usually means that your daily calorie intake should be reduced by 500-750 calories. Eating 1,200-1,500 calories a day can help most women lose weight. Eating 1,500-1,800 calories a day can help most men lose weight. What do I need to know about calorie counting? Work with your health care provider or dietitian to determine how many calories you should get each day. To meet your daily calorie goal, you will need to: Find out how many calories are in each food that you would like to eat. Try to do this before you eat. Decide how much of the food you plan to eat. Keep a food log. Do this by writing down what you ate and how many calories it had. To successfully lose weight, it is important to balance calorie counting with a healthy lifestyle that includes regular activity. Where do I find calorie information? The number of calories in a food can be found on a Nutrition Facts label. If a food does  not have a Nutrition Facts label, try to look up the calories online or ask your dietitian for help. Remember that calories are listed per serving. If you choose to have more than one serving of a food, you will have to multiply the calories per serving by the number of servings you plan to eat. For example, the label on a package of bread might say that a serving size is 1 slice and that there are 90 calories in a serving. If you eat 1 slice, you will have eaten 90 calories. If you eat 2 slices, you will have eaten 180 calories. How do I keep a food log? After each time that you eat, record the following in your food log as soon as possible: What you ate. Be  sure to include toppings, sauces, and other extras on the food. How much you ate. This can be measured in cups, ounces, or number of items. How many calories were in each food and drink. The total number of calories in the food you ate. Keep your food log near you, such as in a pocket-sized notebook or on an app or website on your mobile phone. Some programs will calculate calories for you and show you how many calories you have left to meet your daily goal. What are some portion-control tips? Know how many calories are in a serving. This will help you know how many servings you can have of a certain food. Use a measuring cup to measure serving sizes. You could also try weighing out portions on a kitchen scale. With time, you will be able to estimate serving sizes for some foods. Take time to put servings of different foods on your favorite plates or in your favorite bowls and cups so you know what a serving looks like. Try not to eat straight from a food's packaging, such as from a bag or box. Eating straight from the package makes it hard to see how much you are eating and can lead to overeating. Put the amount you would like to eat in a cup or on a plate to make sure you are eating the right portion. Use smaller plates, glasses, and bowls for smaller portions and to prevent overeating. Try not to multitask. For example, avoid watching TV or using your computer while eating. If it is time to eat, sit down at a table and enjoy your food. This will help you recognize when you are full. It will also help you be more mindful of what and how much you are eating. What are tips for following this plan? Reading food labels Check the calorie count compared with the serving size. The serving size may be smaller than what you are used to eating. Check the source of the calories. Try to choose foods that are high in protein, fiber, and vitamins, and low in saturated fat, trans fat, and sodium. Shopping Read  nutrition labels while you shop. This will help you make healthy decisions about which foods to buy. Pay attention to nutrition labels for low-fat or fat-free foods. These foods sometimes have the same number of calories or more calories than the full-fat versions. They also often have added sugar, starch, or salt to make up for flavor that was removed with the fat. Make a grocery list of lower-calorie foods and stick to it. Cooking Try to cook your favorite foods in a healthier way. For example, try baking instead of frying. Use low-fat dairy products. Meal planning Use more fruits  and vegetables. One-half of your plate should be fruits and vegetables. Include lean proteins, such as chicken, [place], and fish. Lifestyle Each week, aim to do one of the following: 150 minutes of moderate exercise, such as walking. 75 minutes of vigorous exercise, such as running. General information Know how many calories are in the foods you eat most often. This will help you calculate calorie counts faster. Find a way of tracking calories that works for you. Get creative. Try different apps or programs if writing down calories does not work for you. What foods should I eat?  Eat nutritious foods. It is better to have a nutritious, high-calorie food, such as an avocado, than a food with few nutrients, such as a bag of potato chips. Use your calories on foods and drinks that will fill you up and will not leave you hungry soon after eating. Examples of foods that fill you up are nuts and nut butters, vegetables, lean proteins, and high-fiber foods such as whole grains. High-fiber foods are foods with more than 5 g of fiber per serving. Pay attention to calories in drinks. Low-calorie drinks include water and unsweetened drinks. The items listed above may not be a complete list of foods and beverages you can eat. Contact a dietitian for more information. What foods should I limit? Limit foods or drinks that are  not good sources of vitamins, minerals, or protein or that are high in unhealthy fats. These include: Candy. Other sweets. Sodas, specialty coffee drinks, alcohol, and juice. The items listed above may not be a complete list of foods and beverages you should avoid. Contact a dietitian for more information. How do I count calories when eating out? Pay attention to portions. Often, portions are much larger when eating out. Try these tips to keep portions smaller: Consider sharing a meal instead of getting your own. If you get your own meal, eat only half of it. Before you start eating, ask for a container and put half of your meal into it. When available, consider ordering smaller portions from the menu instead of full portions. Pay attention to your food and drink choices. Knowing the way food is cooked and what is included with the meal can help you eat fewer calories. If calories are listed on the menu, choose the lower-calorie options. Choose dishes that include vegetables, fruits, whole grains, low-fat dairy products, and lean proteins. Choose items that are boiled, broiled, grilled, or steamed. Avoid items that are buttered, battered, fried, or served with cream sauce. Items labeled as crispy are usually fried, unless stated otherwise. Choose water, low-fat milk, unsweetened iced tea, or other drinks without added sugar. If you want an alcoholic beverage, choose a lower-calorie option, such as a glass of wine or light beer. Ask for dressings, sauces, and syrups on the side. These are usually high in calories, so you should limit the amount you eat. If you want a salad, choose a garden salad and ask for grilled meats. Avoid extra toppings such as bacon, cheese, or fried items. Ask for the dressing on the side, or ask for olive oil and vinegar or lemon to use as dressing. Estimate how many servings of a food you are given. Knowing serving sizes will help you be aware of how much food you are  eating at restaurants. Where to find more information Centers for Disease Control and Prevention: FootballExhibition.com.br U.S. Department of Agriculture: WrestlingReporter.dk Summary Calorie counting means keeping track of how many calories you eat and drink  each day. If you eat fewer calories than your body needs, you should lose weight. A healthy amount of weight to lose per week is usually 1-2 lb (0.5-0.9 kg). This usually means reducing your daily calorie intake by 500-750 calories. The number of calories in a food can be found on a Nutrition Facts label. If a food does not have a Nutrition Facts label, try to look up the calories online or ask your dietitian for help. Use smaller plates, glasses, and bowls for smaller portions and to prevent overeating. Use your calories on foods and drinks that will fill you up and not leave you hungry shortly after a meal. This information is not intended to replace advice given to you by your health care provider. Make sure you discuss any questions you have with your health care provider. Document Revised: 06/03/2019 Document Reviewed: 06/03/2019 Elsevier Patient Education  2022 [employer].

## 2021-04-04 ENCOUNTER — Other Ambulatory Visit: Payer: Self-pay

## 2021-04-04 ENCOUNTER — Encounter: Payer: Self-pay | Admitting: Nurse Practitioner

## 2021-04-04 ENCOUNTER — Ambulatory Visit: Payer: Managed Care, Other (non HMO) | Admitting: Nurse Practitioner

## 2021-04-04 VITALS — BP 112/66 | HR 117 | Temp 97.3°F | Ht 65.5 in | Wt 203.0 lb

## 2021-04-04 DIAGNOSIS — Z6833 Body mass index (BMI) 33.0-33.9, adult: Secondary | ICD-10-CM | POA: Diagnosis not present

## 2021-04-04 DIAGNOSIS — Z7689 Persons encountering health services in other specified circumstances: Secondary | ICD-10-CM

## 2021-04-04 DIAGNOSIS — E669 Obesity, unspecified: Secondary | ICD-10-CM

## 2021-04-04 NOTE — Progress Notes (Signed)
Subjective:  Patient ID: Bailey Serrano, female    DOB: 22-Aug-2000  Age: 20 y.o. MRN: [id number]  Chief Complaint  Patient presents with   Weight Loss     HPI  Bailey Serrano is a 20 year old Caucasian female that presents to follow-up on weight management. Current BMI 33.27. She was prescribed Adipex 37.5 mg daily to assist with appetite control. She has lost four pounds in 4-weeks. She is experiencing side effects of constipation and insomnia. States she has been taking Melatonin 5 mg at night. Bailey Serrano tells me that she initially took 1/2 tablet of medication initially but did not have suppressed appetite.   Current Outpatient Medications on File Prior to Visit  Medication Sig Dispense Refill   etonogestrel (NEXPLANON) 68 MG IMPL implant 68 mg.     phentermine (ADIPEX-P) 37.5 MG tablet Take 1 tablet (37.5 mg total) by mouth daily before breakfast. 30 tablet 0   No current facility-administered medications on file prior to visit.   Past Medical History:  Diagnosis Date   Patellofemoral syndrome    Tendonitis    Past Surgical History:  Procedure Laterality Date   GALLBLADDER SURGERY      Family History  Adopted: Yes  Problem Relation Age of Onset   Hypertension Mother    Muscular dystrophy Brother    COPD Maternal Grandmother    Diabetes Other    Aneurysm Other    Social History   Socioeconomic History   Marital status: Single    Spouse name: Not on file   Number of children: Not on file   Years of education: Not on file   Highest education level: Not on file  Occupational History   Not on file  Tobacco Use   Smoking status: Never   Smokeless tobacco: Never  Vaping Use   Vaping Use: Every day   Substances: Nicotine, Flavoring  Substance and Sexual Activity   Alcohol use: Never   Drug use: Never   Sexual activity: Yes    Birth control/protection: Implant  Other Topics Concern   Not on file  Social History Narrative   Not on file   Social Determinants of  Health   Financial Resource Strain: Not on file  Food Insecurity: Not on file  Transportation Needs: Not on file  Physical Activity: Not on file  Stress: Not on file  Social Connections: Not on file    Review of Systems  Constitutional:  Negative for chills, fatigue and fever.  HENT:  Negative for congestion, ear pain, postnasal drip, rhinorrhea, sinus pressure, sinus pain and sore throat.   Eyes: Negative.   Respiratory:  Negative for cough and shortness of breath.   Cardiovascular:  Negative for chest pain.  Gastrointestinal:  Positive for constipation.  Endocrine: Negative.   Genitourinary: Negative.   Musculoskeletal: Negative.   Skin: Negative.   Allergic/Immunologic: Negative.   Neurological: Negative.   Psychiatric/Behavioral:  Positive for sleep disturbance (insomnia).     Objective:  Pulse (!) 117   Temp (!) 97.3 F (36.3 C)   Ht 5' 5.5" (1.664 m)   Wt 203 lb (92.1 kg)   SpO2 98%   BMI 33.27 kg/m  BP 112/66   Pulse (!) 117   Temp (!) 97.3 F (36.3 C)   Ht 5' 5.5" (1.664 m)   Wt 203 lb (92.1 kg)   SpO2 98%   BMI 33.27 kg/m    BP/Weight 04/04/2021 03/07/2021 02/07/2021  Systolic BP - 120 112  Diastolic BP - 64 62  Wt. (Lbs) 203 207 206  BMI 33.27 32.42 33.76    Physical Exam Vitals reviewed.  Constitutional:      Appearance: Normal appearance.  Cardiovascular:     Rate and Rhythm: Regular rhythm. Tachycardia present.     Pulses: Normal pulses.     Heart sounds: Normal heart sounds.  Pulmonary:     Effort: Pulmonary effort is normal.     Breath sounds: Normal breath sounds.  Abdominal:     General: Bowel sounds are normal.     Palpations: Abdomen is soft.  Musculoskeletal:        General: Normal range of motion.  Skin:    General: Skin is warm and dry.     Capillary Refill: Capillary refill takes less than 2 seconds.  Neurological:     General: No focal deficit present.     Mental Status: She is alert and oriented to person, place, and  time.  Psychiatric:        Mood and Affect: Mood normal.        Behavior: Behavior normal.      Lab Results  Component Value Date   WBC 11.3 (H) 02/07/2021   HGB 15.5 02/07/2021   HCT 46.7 (H) 02/07/2021   PLT 337 02/07/2021   GLUCOSE 79 02/07/2021   ALT 20 02/07/2021   AST 19 02/07/2021   NA 142 02/07/2021   K 4.4 02/07/2021   CL 104 02/07/2021   CREATININE 0.71 02/07/2021   BUN 8 02/07/2021   CO2 19 (L) 02/07/2021   TSH 1.550 02/07/2021      Assessment & Plan:   1. Obesity (BMI 30.0-34.9) -continue heart healthy diet and physical activity -take Miralax as needed for constipation -take Phentermine 37.5 mg every other day to prevent insomnia  2. Encounter for weight management -heart healthy diet -physical activity  3. BMI 33.0-33.9,adult   Continue medication as prescribed Follow-up in 22-months   Follow-up: 22-months  An After Visit Summary was printed and given to the patient.  I, Janie Morning, NP, have reviewed all documentation for this visit. The documentation on 04/04/21 for the exam, diagnosis, procedures, and orders are all accurate and complete.    Signed, Janie Morning, NP [facility] [phone]

## 2021-04-04 NOTE — Patient Instructions (Signed)
Continue medication as prescribed Follow-up in 75-months    Calorie Counting for Weight Loss Calories are units of energy. Your body needs a certain number of calories from food to keep going throughout the day. When you eat or drink more calories than your body needs, your body stores the extra calories mostly as fat. When you eat or drink fewer calories than your body needs, your body burns fat to get the energy it needs. Calorie counting means keeping track of how many calories you eat and drink each day. Calorie counting can be helpful if you need to lose weight. If you eat fewer calories than your body needs, you should lose weight. Ask your health care provider what a healthy weight is for you. For calorie counting to work, you will need to eat the right number of calories each day to lose a healthy amount of weight per week. A dietitian can help you figure out how many calories you need in a day and will suggest ways to reach your calorie goal. A healthy amount of weight to lose each week is usually 1-2 lb (0.5-0.9 kg). This usually means that your daily calorie intake should be reduced by 500-750 calories. Eating 1,200-1,500 calories a day can help most women lose weight. Eating 1,500-1,800 calories a day can help most men lose weight. What do I need to know about calorie counting? Work with your health care provider or dietitian to determine how many calories you should get each day. To meet your daily calorie goal, you will need to: Find out how many calories are in each food that you would like to eat. Try to do this before you eat. Decide how much of the food you plan to eat. Keep a food log. Do this by writing down what you ate and how many calories it had. To successfully lose weight, it is important to balance calorie counting with a healthy lifestyle that includes regular activity. Where do I find calorie information? The number of calories in a food can be found on a Nutrition  Facts label. If a food does not have a Nutrition Facts label, try to look up the calories online or ask your dietitian for help. Remember that calories are listed per serving. If you choose to have more than one serving of a food, you will have to multiply the calories per serving by the number of servings you plan to eat. For example, the label on a package of bread might say that a serving size is 1 slice and that there are 90 calories in a serving. If you eat 1 slice, you will have eaten 90 calories. If you eat 2 slices, you will have eaten 180 calories. How do I keep a food log? After each time that you eat, record the following in your food log as soon as possible: What you ate. Be sure to include toppings, sauces, and other extras on the food. How much you ate. This can be measured in cups, ounces, or number of items. How many calories were in each food and drink. The total number of calories in the food you ate. Keep your food log near you, such as in a pocket-sized notebook or on an app or website on your mobile phone. Some programs will calculate calories for you and show you how many calories you have left to meet your daily goal. What are some portion-control tips? Know how many calories are in a serving. This will help you know  how many servings you can have of a certain food. Use a measuring cup to measure serving sizes. You could also try weighing out portions on a kitchen scale. With time, you will be able to estimate serving sizes for some foods. Take time to put servings of different foods on your favorite plates or in your favorite bowls and cups so you know what a serving looks like. Try not to eat straight from a food's packaging, such as from a bag or box. Eating straight from the package makes it hard to see how much you are eating and can lead to overeating. Put the amount you would like to eat in a cup or on a plate to make sure you are eating the right portion. Use smaller  plates, glasses, and bowls for smaller portions and to prevent overeating. Try not to multitask. For example, avoid watching TV or using your computer while eating. If it is time to eat, sit down at a table and enjoy your food. This will help you recognize when you are full. It will also help you be more mindful of what and how much you are eating. What are tips for following this plan? Reading food labels Check the calorie count compared with the serving size. The serving size may be smaller than what you are used to eating. Check the source of the calories. Try to choose foods that are high in protein, fiber, and vitamins, and low in saturated fat, trans fat, and sodium. Shopping Read nutrition labels while you shop. This will help you make healthy decisions about which foods to buy. Pay attention to nutrition labels for low-fat or fat-free foods. These foods sometimes have the same number of calories or more calories than the full-fat versions. They also often have added sugar, starch, or salt to make up for flavor that was removed with the fat. Make a grocery list of lower-calorie foods and stick to it. Cooking Try to cook your favorite foods in a healthier way. For example, try baking instead of frying. Use low-fat dairy products. Meal planning Use more fruits and vegetables. One-half of your plate should be fruits and vegetables. Include lean proteins, such as chicken, [place], and fish. Lifestyle Each week, aim to do one of the following: 150 minutes of moderate exercise, such as walking. 75 minutes of vigorous exercise, such as running. General information Know how many calories are in the foods you eat most often. This will help you calculate calorie counts faster. Find a way of tracking calories that works for you. Get creative. Try different apps or programs if writing down calories does not work for you. What foods should I eat?  Eat nutritious foods. It is better to have a  nutritious, high-calorie food, such as an avocado, than a food with few nutrients, such as a bag of potato chips. Use your calories on foods and drinks that will fill you up and will not leave you hungry soon after eating. Examples of foods that fill you up are nuts and nut butters, vegetables, lean proteins, and high-fiber foods such as whole grains. High-fiber foods are foods with more than 5 g of fiber per serving. Pay attention to calories in drinks. Low-calorie drinks include water and unsweetened drinks. The items listed above may not be a complete list of foods and beverages you can eat. Contact a dietitian for more information. What foods should I limit? Limit foods or drinks that are not good sources of vitamins, minerals, or  protein or that are high in unhealthy fats. These include: Candy. Other sweets. Sodas, specialty coffee drinks, alcohol, and juice. The items listed above may not be a complete list of foods and beverages you should avoid. Contact a dietitian for more information. How do I count calories when eating out? Pay attention to portions. Often, portions are much larger when eating out. Try these tips to keep portions smaller: Consider sharing a meal instead of getting your own. If you get your own meal, eat only half of it. Before you start eating, ask for a container and put half of your meal into it. When available, consider ordering smaller portions from the menu instead of full portions. Pay attention to your food and drink choices. Knowing the way food is cooked and what is included with the meal can help you eat fewer calories. If calories are listed on the menu, choose the lower-calorie options. Choose dishes that include vegetables, fruits, whole grains, low-fat dairy products, and lean proteins. Choose items that are boiled, broiled, grilled, or steamed. Avoid items that are buttered, battered, fried, or served with cream sauce. Items labeled as crispy are usually  fried, unless stated otherwise. Choose water, low-fat milk, unsweetened iced tea, or other drinks without added sugar. If you want an alcoholic beverage, choose a lower-calorie option, such as a glass of wine or light beer. Ask for dressings, sauces, and syrups on the side. These are usually high in calories, so you should limit the amount you eat. If you want a salad, choose a garden salad and ask for grilled meats. Avoid extra toppings such as bacon, cheese, or fried items. Ask for the dressing on the side, or ask for olive oil and vinegar or lemon to use as dressing. Estimate how many servings of a food you are given. Knowing serving sizes will help you be aware of how much food you are eating at restaurants. Where to find more information Centers for Disease Control and Prevention: FootballExhibition.com.br U.[place]. Department of Agriculture: WrestlingReporter.dk Summary Calorie counting means keeping track of how many calories you eat and drink each day. If you eat fewer calories than your body needs, you should lose weight. A healthy amount of weight to lose per week is usually 1-2 lb (0.5-0.9 kg). This usually means reducing your daily calorie intake by 500-750 calories. The number of calories in a food can be found on a Nutrition Facts label. If a food does not have a Nutrition Facts label, try to look up the calories online or ask your dietitian for help. Use smaller plates, glasses, and bowls for smaller portions and to prevent overeating. Use your calories on foods and drinks that will fill you up and not leave you hungry shortly after a meal. This information is not intended to replace advice given to you by your health care provider. Make sure you discuss any questions you have with your health care provider. Document Revised: 06/03/2019 Document Reviewed: 06/03/2019 Elsevier Patient Education  2022 [employer].

## 2021-04-18 ENCOUNTER — Other Ambulatory Visit: Payer: Self-pay

## 2021-04-18 DIAGNOSIS — Z7689 Persons encountering health services in other specified circumstances: Secondary | ICD-10-CM

## 2021-04-18 DIAGNOSIS — E669 Obesity, unspecified: Secondary | ICD-10-CM

## 2021-04-18 DIAGNOSIS — Z6832 Body mass index (BMI) 32.0-32.9, adult: Secondary | ICD-10-CM

## 2021-04-18 MED ORDER — PHENTERMINE HCL 37.5 MG PO TABS: 37.5000 mg | ORAL_TABLET | Freq: Every day | ORAL | 0 refills | Status: DC

## 2021-04-18 NOTE — Telephone Encounter (Signed)
Patient requesting 3 month supply, through refills.   Lorita Officer, CCMA 04/18/21 8:12 AM

## 2021-06-08 ENCOUNTER — Other Ambulatory Visit: Payer: Self-pay | Admitting: Nurse Practitioner

## 2021-06-08 DIAGNOSIS — Z6832 Body mass index (BMI) 32.0-32.9, adult: Secondary | ICD-10-CM

## 2021-06-08 DIAGNOSIS — Z7689 Persons encountering health services in other specified circumstances: Secondary | ICD-10-CM

## 2021-06-08 DIAGNOSIS — E669 Obesity, unspecified: Secondary | ICD-10-CM

## 2021-06-08 MED ORDER — PHENTERMINE HCL 37.5 MG PO TABS: 37.5000 mg | ORAL_TABLET | Freq: Every day | ORAL | 0 refills | Status: DC

## 2021-06-19 ENCOUNTER — Telehealth: Payer: Self-pay

## 2021-06-19 NOTE — Telephone Encounter (Signed)
Patient is approved for phentermine 37.5 mg.

## 2021-07-04 ENCOUNTER — Ambulatory Visit: Payer: Self-pay | Admitting: Nurse Practitioner

## 2021-07-12 ENCOUNTER — Other Ambulatory Visit: Payer: Self-pay

## 2021-07-12 ENCOUNTER — Ambulatory Visit: Payer: BC Managed Care – PPO | Admitting: Nurse Practitioner

## 2021-07-12 ENCOUNTER — Other Ambulatory Visit: Payer: Self-pay | Admitting: Nurse Practitioner

## 2021-07-12 ENCOUNTER — Encounter: Payer: Self-pay | Admitting: Nurse Practitioner

## 2021-07-12 VITALS — BP 120/68 | HR 106 | Temp 97.0°F | Ht 65.5 in | Wt 192.0 lb

## 2021-07-12 DIAGNOSIS — Z6832 Body mass index (BMI) 32.0-32.9, adult: Secondary | ICD-10-CM

## 2021-07-12 DIAGNOSIS — E669 Obesity, unspecified: Secondary | ICD-10-CM

## 2021-07-12 DIAGNOSIS — Z6831 Body mass index (BMI) 31.0-31.9, adult: Secondary | ICD-10-CM | POA: Diagnosis not present

## 2021-07-12 DIAGNOSIS — Z7689 Persons encountering health services in other specified circumstances: Secondary | ICD-10-CM

## 2021-07-12 DIAGNOSIS — R110 Nausea: Secondary | ICD-10-CM | POA: Diagnosis not present

## 2021-07-12 DIAGNOSIS — E66811 Obesity, class 1: Secondary | ICD-10-CM

## 2021-07-12 MED ORDER — WEGOVY 0.5 MG/0.5ML ~~LOC~~ SOAJ: 0.5000 mg | SUBCUTANEOUS | 0 refills | Status: DC

## 2021-07-12 MED ORDER — SEMAGLUTIDE-WEIGHT MANAGEMENT 1 MG/0.5ML ~~LOC~~ SOAJ: 1.0000 mg | SUBCUTANEOUS | 0 refills | Status: AC

## 2021-07-12 MED ORDER — SEMAGLUTIDE-WEIGHT MANAGEMENT 0.25 MG/0.5ML ~~LOC~~ SOAJ: 0.2500 mg | SUBCUTANEOUS | 0 refills | Status: AC

## 2021-07-12 MED ORDER — ONDANSETRON HCL 4 MG PO TABS: 4.0000 mg | ORAL_TABLET | Freq: Three times a day (TID) | ORAL | 0 refills | Status: DC | PRN

## 2021-07-12 MED ORDER — SEMAGLUTIDE-WEIGHT MANAGEMENT 1.7 MG/0.75ML ~~LOC~~ SOAJ: 1.7000 mg | SUBCUTANEOUS | 0 refills | Status: AC

## 2021-07-12 MED ORDER — SEMAGLUTIDE-WEIGHT MANAGEMENT 0.5 MG/0.5ML ~~LOC~~ SOAJ: 0.5000 mg | SUBCUTANEOUS | 0 refills | Status: AC

## 2021-07-12 MED ORDER — SEMAGLUTIDE-WEIGHT MANAGEMENT 2.4 MG/0.75ML ~~LOC~~ SOAJ: 2.4000 mg | SUBCUTANEOUS | 3 refills | Status: AC

## 2021-07-12 MED ORDER — PHENTERMINE HCL 37.5 MG PO TABS: 37.5000 mg | ORAL_TABLET | Freq: Every day | ORAL | 0 refills | Status: DC

## 2021-07-12 NOTE — Patient Instructions (Signed)
Begin Wegovy 0.25 mg injection weekly for 4 weeks, then increase to 0.5 mg injection weekly for 4 weeks, then increase to 1 mg weekly for 4 weeks, then increase to 1.7 mg weekly for 4 weeks, and then increase to 2.4 mg weekly for 4 weeks ?We will notify you of prior authorization ?Take Zofran as needed for nausea ?Notify office of any adverse side effects ?Follow-up in 64-months ? ? ?Semaglutide Injection (Weight Management) ?What is this medication? ?SEMAGLUTIDE (SEM a GLOO tide) promotes weight loss. It may also be used to maintain weight loss. It works by decreasing appetite. Changes to diet and exercise are often combined with this medication. ?This medicine may be used for other purposes; ask your health care provider or pharmacist if you have questions. ?COMMON BRAND NAME(S): Wegovy ?What should I tell my care team before I take this medication? ?They need to know if you have any of these conditions: ?Endocrine tumors (MEN 2) or if someone in your family had these tumors ?Eye disease, vision problems ?Gallbladder disease ?History of depression or mental health disease ?History of pancreatitis ?Kidney disease ?Stomach or intestine problems ?Suicidal thoughts, plans, or attempt; a previous suicide attempt by you or a family member ?Thyroid cancer or if someone in your family had thyroid cancer ?An unusual or allergic reaction to semaglutide, other medications, foods, dyes, or preservatives ?Pregnant or trying to get pregnant ?Breast-feeding ?How should I use this medication? ?This medication is injected under the skin. You will be taught how to prepare and give it. Take it as directed on the prescription label. It is given once every week (every 7 days). Keep taking it unless your care team tells you to stop. ?It is important that you put your used needles and pens in a special sharps container. Do not put them in a trash can. If you do not have a sharps container, call your pharmacist or care team to get one. ?A  special MedGuide will be given to you by the pharmacist with each prescription and refill. Be sure to read this information carefully each time. ?This medication comes with INSTRUCTIONS FOR USE. Ask your pharmacist for directions on how to use this medication. Read the information carefully. Talk to your pharmacist or care team if you have questions. ?Talk to your care team about the use of this medication in children. Special care may be needed. ?Overdosage: If you think you have taken too much of this medicine contact a poison control center or emergency room at once. ?NOTE: This medicine is only for you. Do not share this medicine with others. ?What if I miss a dose? ?If you miss a dose and the next scheduled dose is more than 2 days away, take the missed dose as soon as possible. If you miss a dose and the next scheduled dose is less than 2 days away, do not take the missed dose. Take the next dose at your regular time. Do not take double or extra doses. If you miss your dose for 2 weeks or more, take the next dose at your regular time or call your care team to talk about how to restart this medication. ?What may interact with this medication? ?Insulin and other medications for diabetes ?This list may not describe all possible interactions. Give your health care provider a list of all the medicines, herbs, non-prescription drugs, or dietary supplements you use. Also tell them if you smoke, drink alcohol, or use illegal drugs. Some items may interact with your  medicine. ?What should I watch for while using this medication? ?Visit your care team for regular checks on your progress. It may be some time before you see the benefit from this medication. ?Drink plenty of fluids while taking this medication. Check with your care team if you have severe diarrhea, nausea, and vomiting, or if you sweat a lot. The loss of too much body fluid may make it dangerous for you to take this medication. ?This medication may affect  blood sugar levels. Ask your care team if changes in diet or medications are needed if you have diabetes. ?If you or your family notice any changes in your behavior, such as new or worsening depression, thoughts of harming yourself, anxiety, other unusual or disturbing thoughts, or memory loss, call your care team right away. ?Women should inform their care team if they wish to become pregnant or think they might be pregnant. Losing weight while pregnant is not advised and may cause harm to the unborn child. Talk to your care team for more information. ?What side effects may I notice from receiving this medication? ?Side effects that you should report to your care team as soon as possible: ?Allergic reactions--skin rash, itching, hives, swelling of the face, lips, tongue, or throat ?Change in vision ?Dehydration--increased thirst, dry mouth, feeling faint or lightheaded, headache, dark yellow or brown urine ?Gallbladder problems--severe stomach pain, nausea, vomiting, fever ?Heart palpitations--rapid, pounding, or irregular heartbeat ?Kidney injury--decrease in the amount of urine, swelling of the ankles, hands, or feet ?Pancreatitis--severe stomach pain that spreads to your back or gets worse after eating or when touched, fever, nausea, vomiting ?Thoughts of suicide or self-harm, worsening mood, feelings of depression ?Thyroid cancer--new mass or lump in the neck, pain or trouble swallowing, trouble breathing, hoarseness ?Side effects that usually do not require medical attention (report to your care team if they continue or are bothersome): ?Diarrhea ?Loss of appetite ?Nausea ?Stomach pain ?Vomiting ?This list may not describe all possible side effects. Call your doctor for medical advice about side effects. You may report side effects to FDA at 1-800-FDA-1088. ?Where should I keep my medication? ?Keep out of the reach of children and pets. ?Refrigeration (preferred): Store in the refrigerator. Do not freeze. Keep  this medication in the original container until you are ready to take it. Get rid of any unused medication after the expiration date. ?Room temperature: If needed, prior to cap removal, the pen can be stored at room temperature for up to 28 days. Protect from light. If it is stored at room temperature, get rid of any unused medication after 28 days or after it expires, whichever is first. ?It is important to get rid of the medication as soon as you no longer need it or it is expired. You can do this in two ways: ?Take the medication to a medication take-back program. Check with your pharmacy or law enforcement to find a location. ?If you cannot return the medication, follow the directions in the MedGuide. ?NOTE: This sheet is a summary. It may not cover all possible information. If you have questions about this medicine, talk to your doctor, pharmacist, or health care provider. ?? 2022 Elsevier/Gold Standard (2020-07-28 00:00:00) ? ?

## 2021-07-12 NOTE — Progress Notes (Signed)
? ?Subjective:  ?Patient ID: Bailey Serrano, female    DOB: [birth year]  Age: 21 y.o. MRN: [id number] ? ?Chief Complaint  ?Patient presents with  ? Weight Loss Management  ? ? ?HPI ?  ? Bailey Serrano is a 21 year old Caucasian female that presents to follow-up on weight management. Current BMI 33.27. She was prescribed Adipex 37.5 mg daily to assist with appetite control. She has lost four pounds 15 lbs since 03/2021. [facility]. Denies side effects of Adipex.  ? ?Current Outpatient Medications on File Prior to Visit  ?Medication Sig Dispense Refill  ? etonogestrel (NEXPLANON) 68 MG IMPL implant 68 mg.    ? phentermine (ADIPEX-P) 37.5 MG tablet Take 1 tablet (37.5 mg total) by mouth daily before breakfast. 30 tablet 0  ? ?No current facility-administered medications on file prior to visit.  ? ?Past Medical History:  ?Diagnosis Date  ? Patellofemoral syndrome   ? Tendonitis   ? ?Past Surgical History:  ?Procedure Laterality Date  ? GALLBLADDER SURGERY    ?  ?Family History  ?Adopted: Yes  ?Problem Relation Age of Onset  ? Hypertension Mother   ? Muscular dystrophy Brother   ? COPD Maternal Grandmother   ? Diabetes Other   ? Aneurysm Other   ? ?Social History  ? ?Socioeconomic History  ? Marital status: Single  ?  Spouse name: Not on file  ? Number of children: Not on file  ? Years of education: Not on file  ? Highest education level: Not on file  ?Occupational History  ? Not on file  ?Tobacco Use  ? Smoking status: Never  ? Smokeless tobacco: Never  ?Vaping Use  ? Vaping Use: Every day  ? Substances: Nicotine, Flavoring  ?Substance and Sexual Activity  ? Alcohol use: Never  ? Drug use: Never  ? Sexual activity: Yes  ?  Birth control/protection: Implant  ?Other Topics Concern  ? Not on file  ?Social History Narrative  ? Not on file  ? ?Social Determinants of Health  ? ?Financial Resource Strain: Not on file  ?Food Insecurity: Not on file  ?Transportation Needs: Not on file   ?Physical Activity: Not on file  ?Stress: Not on file  ?Social Connections: Not on file  ? ? ?Review of Systems  ?Constitutional:  Negative for chills, fatigue and fever.  ?HENT:  Negative for congestion, ear pain, postnasal drip, rhinorrhea, sinus pressure, sinus pain and sore throat.   ?Respiratory:  Negative for cough and shortness of breath.   ?Cardiovascular:  Negative for chest pain.  ?Gastrointestinal:  Negative for diarrhea and nausea.  ? ? ?Objective:  ?BP 120/68   Pulse (!) 106   Temp (!) 97 ?F (36.1 ?C)   Ht 5' 5.5" (1.664 m)   Wt 192 lb (87.1 kg)   SpO2 98%   BMI 31.46 kg/m?   ? ?BP/Weight 07/12/2021 04/04/2021 03/07/2021  ?Systolic BP - 112 120  ?Diastolic BP - 66 64  ?Wt. (Lbs) 192 203 207  ?BMI 31.46 33.27 32.42  ? ? ?Physical Exam ?Vitals reviewed.  ?Constitutional:   ?   Appearance: She is obese.  ?Cardiovascular:  ?   Rate and Rhythm: Regular rhythm. Tachycardia present.  ?   Pulses: Normal pulses.  ?   Heart sounds: Normal heart sounds.  ?Pulmonary:  ?   Effort: Pulmonary effort is normal.  ?   Breath sounds: Normal breath sounds.  ?Skin: ?   General: Skin is warm and dry.  ?  Capillary Refill: Capillary refill takes less than 2 seconds.  ?Neurological:  ?   General: No focal deficit present.  ?   Mental Status: She is alert and oriented to person, place, and time.  ?Psychiatric:     ?   Mood and Affect: Mood normal.     ?   Behavior: Behavior normal.  ? ? ? ?  ? ?Lab Results  ?Component Value Date  ? WBC 11.3 (H) 02/07/2021  ? HGB 15.5 02/07/2021  ? HCT 46.7 (H) 02/07/2021  ? PLT 337 02/07/2021  ? GLUCOSE 79 02/07/2021  ? ALT 20 02/07/2021  ? AST 19 02/07/2021  ? NA 142 02/07/2021  ? K 4.4 02/07/2021  ? CL 104 02/07/2021  ? CREATININE 0.71 02/07/2021  ? BUN 8 02/07/2021  ? CO2 19 (L) 02/07/2021  ? TSH 1.550 02/07/2021  ? ? ? ? ?Assessment & Plan:  ? ?1. Obesity (BMI 30.0-34.9) ?- Semaglutide-Weight Management (WEGOVY) 0.5 MG/0.5ML SOAJ; Inject 0.5 mg into the skin once a week.  Dispense: 2  mL; Refill: 0 ?- Semaglutide-Weight Management 0.25 MG/0.5ML SOAJ; Inject 0.25 mg into the skin once a week for 28 days.  Dispense: 2 mL; Refill: 0 ?- Semaglutide-Weight Management 1 MG/0.5ML SOAJ; Inject 1 mg into the skin once a week for 28 days.  Dispense: 2 mL; Refill: 0 ?- Semaglutide-Weight Management 1.7 MG/0.75ML SOAJ; Inject 1.7 mg into the skin once a week for 28 days.  Dispense: 3 mL; Refill: 0 ?- Semaglutide-Weight Management 2.4 MG/0.75ML SOAJ; Inject 2.4 mg into the skin once a week for 28 days.  Dispense: 3 mL; Refill: 3 ?- Semaglutide-Weight Management 0.5 MG/0.5ML SOAJ; Inject 0.5 mg into the skin once a week for 28 days.  Dispense: 2 mL; Refill: 0 ? ?2. BMI 31.0-31.9,adult ?- Semaglutide-Weight Management (WEGOVY) 0.5 MG/0.5ML SOAJ; Inject 0.5 mg into the skin once a week.  Dispense: 2 mL; Refill: 0 ?- Semaglutide-Weight Management 0.25 MG/0.5ML SOAJ; Inject 0.25 mg into the skin once a week for 28 days.  Dispense: 2 mL; Refill: 0 ?- Semaglutide-Weight Management 1 MG/0.5ML SOAJ; Inject 1 mg into the skin once a week for 28 days.  Dispense: 2 mL; Refill: 0 ?- Semaglutide-Weight Management 1.7 MG/0.75ML SOAJ; Inject 1.7 mg into the skin once a week for 28 days.  Dispense: 3 mL; Refill: 0 ?- Semaglutide-Weight Management 2.4 MG/0.75ML SOAJ; Inject 2.4 mg into the skin once a week for 28 days.  Dispense: 3 mL; Refill: 3 ?- Semaglutide-Weight Management 0.5 MG/0.5ML SOAJ; Inject 0.5 mg into the skin once a week for 28 days.  Dispense: 2 mL; Refill: 0 ? ?3. Nausea ?- ondansetron (ZOFRAN) 4 MG tablet; Take 1 tablet (4 mg total) by mouth every 8 (eight) hours as needed for nausea or vomiting.  Dispense: 20 tablet; Refill: 0 ?   ? ? ?Begin Wegovy 0.25 mg injection weekly for 4 weeks, then increase to 0.5 mg injection weekly for 4 weeks, then increase to 1 mg weekly for 4 weeks, then increase to 1.7 mg weekly for 4 weeks, and then increase to 2.4 mg weekly for 4 weeks ?We will notify you of prior  authorization ?Take Zofran as needed for nausea ?Notify office of any adverse side effects ?Follow-up in 58-months ?  ? ?Follow-up: 29-months ? ?An After Visit Summary was printed and given to the patient. ? ?I, Janie Morning, NP, have reviewed all documentation for this visit. The documentation on 07/12/21 for the exam, diagnosis, procedures, and orders are  all accurate and complete.  ? ? ?Signed, ?Janie Morning, NP ?[facility] ?([phone] ?

## 2021-07-13 ENCOUNTER — Telehealth: Payer: Self-pay

## 2021-07-13 NOTE — Telephone Encounter (Signed)
PA for [facility] submitted and approved via covermymeds 07-12-21 through 02-11-2022 ?

## 2021-09-04 ENCOUNTER — Encounter: Payer: Self-pay | Admitting: Nurse Practitioner

## 2021-09-04 ENCOUNTER — Telehealth: Payer: BC Managed Care – PPO | Admitting: Nurse Practitioner

## 2021-09-04 VITALS — BP 104/72 | HR 113 | Temp 98.3°F | Ht 65.5 in | Wt 183.0 lb

## 2021-09-04 DIAGNOSIS — R112 Nausea with vomiting, unspecified: Secondary | ICD-10-CM | POA: Diagnosis not present

## 2021-09-04 DIAGNOSIS — E669 Obesity, unspecified: Secondary | ICD-10-CM

## 2021-09-04 DIAGNOSIS — K5903 Drug induced constipation: Secondary | ICD-10-CM | POA: Diagnosis not present

## 2021-09-04 DIAGNOSIS — Z6829 Body mass index (BMI) 29.0-29.9, adult: Secondary | ICD-10-CM | POA: Diagnosis not present

## 2021-09-04 LAB — POCT URINE PREGNANCY: Preg Test, Ur: NEGATIVE

## 2021-09-04 MED ORDER — PROMETHAZINE HCL 12.5 MG PO TABS
12.5000 mg | ORAL_TABLET | Freq: Four times a day (QID) | ORAL | 1 refills | Status: DC | PRN
Start: 2021-09-04 — End: 2022-02-26

## 2021-09-04 MED ORDER — LINACLOTIDE 72 MCG PO CAPS: 72.0000 ug | ORAL_CAPSULE | Freq: Every day | ORAL | 0 refills | Status: DC

## 2021-09-04 MED ORDER — WEGOVY 0.25 MG/0.5ML ~~LOC~~ SOAJ: 0.2500 mg | SUBCUTANEOUS | 0 refills | Status: DC

## 2021-09-04 NOTE — Patient Instructions (Addendum)
Stop Adipex 37.5 mg ?Decrease Wegovy to 0.25 mg weekly for four weeks, then increase to 0.5 mg for 4 weeks ?High fiber diet ?Take Linzess 72 mcg once daily for four days  ?Recommend stool softener twice daily to prevent constipation ?Continue to push fluids ?Take Phenergan 12.5 mg as needed for nausea ?Follow-up June 13th, 2023 at 3:00 or sooner if needed ? ? ?Constipation, Adult ?Constipation is when a person has trouble pooping (having a bowel movement). When you have this condition, you may poop fewer than 3 times a week. Your poop (stool) may also be dry, hard, or bigger than normal. ?Follow these instructions at home: ?Eating and drinking ? ?Eat foods that have a lot of fiber, such as: ?Fresh fruits and vegetables. ?Whole grains. ?Beans. ?Eat less of foods that are low in fiber and high in fat and sugar, such as: ?[place] fries. ?Hamburgers. ?Cookies. ?Candy. ?Soda. ?Drink enough fluid to keep your pee (urine) pale yellow. ?General instructions ?Exercise regularly or as told by your doctor. Try to do 150 minutes of exercise each week. ?Go to the restroom when you feel like you need to poop. Do not hold it in. ?Take over-the-counter and prescription medicines only as told by your doctor. These include any fiber supplements. ?When you poop: ?Do deep breathing while relaxing your lower belly (abdomen). ?Relax your pelvic floor. The pelvic floor is a group of muscles that support the rectum, bladder, and intestines (as well as the uterus in women). ?Watch your condition for any changes. Tell your doctor if you notice any. ?Keep all follow-up visits as told by your doctor. This is important. ?Contact a doctor if: ?You have pain that gets worse. ?You have a fever. ?You have not pooped for 4 days. ?You vomit. ?You are not hungry. ?You lose weight. ?You are bleeding from the opening of the butt (anus). ?You have thin, pencil-like poop. ?Get help right away if: ?You have a fever, and your symptoms suddenly get  worse. ?You leak poop or have blood in your poop. ?Your belly feels hard or bigger than normal (bloated). ?You have very bad belly pain. ?You feel dizzy or you faint. ?Summary ?Constipation is when a person poops fewer than 3 times a week, has trouble pooping, or has poop that is dry, hard, or bigger than normal. ?Eat foods that have a lot of fiber. ?Drink enough fluid to keep your pee (urine) pale yellow. ?Take over-the-counter and prescription medicines only as told by your doctor. These include any fiber supplements. ?This information is not intended to replace advice given to you by your health care provider. Make sure you discuss any questions you have with your health care provider. ?Document Revised: 03/10/2019 Document Reviewed: 03/10/2019 ?Elsevier Patient Education ? 2023 Elsevier Inc. ?Nausea and Vomiting, Adult ?Nausea is feeling that you have an upset stomach and that you are about to vomit. Vomiting is when food in your stomach forcefully comes out of your mouth. Vomiting can make you feel weak. If you vomit, or if you are not able to drink enough fluids, you may not have enough water in your body (get dehydrated). If you do not have enough water in your body, you may: ?Feel tired. ?Feel thirsty. ?Have a dry mouth. ?Have cracked lips. ?Pee (urinate) less often. ?Older adults and people with other diseases or a weak body defense system (immune system) are at higher risk for not having enough water in the body. If you feel like you may vomit or you  vomit, it is important to follow instructions from your doctor about how to take care of yourself. ?Follow these instructions at home: ?Watch your symptoms for any changes. Tell your doctor about them. ?Eating and drinking ? ?  ? ?Take an ORS (oral rehydration solution). This is a drink that is sold at pharmacies and stores. ?Drink clear fluids in small amounts as you are able, such as: ?Water. ?Ice chips. ?Fruit juice that has water added (diluted fruit  juice). ?Low-calorie sports drinks. ?Eat bland, easy-to-digest foods in small amounts as you are able, such as: ?Bananas. ?Applesauce. ?Rice. ?Low-fat (lean) meats. ?Toast. ?Crackers. ?Avoid drinking fluids that have a lot of sugar or caffeine in them. This includes energy drinks, sports drinks, and soda. ?Avoid alcohol. ?Avoid spicy or fatty foods. ?General instructions ?Take over-the-counter and prescription medicines only as told by your doctor. ?Drink enough fluid to keep your pee (urine) pale yellow. ?Wash your hands often with soap and water for at least 20 seconds. If you cannot use soap and water, use hand sanitizer. ?Make sure that everyone in your home washes their hands well and often. ?Rest at home until you feel better. ?Watch your condition for any changes. ?Take slow and deep breaths when you feel like you may vomit. ?Keep all follow-up visits. ?Contact a doctor if: ?Your symptoms get worse. ?You have new symptoms. ?You have a fever. ?You cannot drink fluids without vomiting. ?You feel like you may vomit for more than 2 days. ?You feel light-headed or dizzy. ?You have a headache. ?You have muscle cramps. ?You have a rash. ?You have pain while peeing. ?Get help right away if: ?You have pain in your chest, neck, arm, or jaw. ?You feel very weak or you faint. ?You vomit again and again. ?You have vomit that is bright red or looks like black coffee grounds. ?You have bloody or black poop (stools) or poop that looks like tar. ?You have a very bad headache, a stiff neck, or both. ?You have very bad pain, cramping, or bloating in your belly (abdomen). ?You have trouble breathing. ?You are breathing very quickly. ?Your heart is beating very quickly. ?Your skin feels cold and clammy. ?You feel confused. ?You have signs of losing too much water in your body, such as: ?Dark pee, very little pee, or no pee. ?Cracked lips. ?Dry mouth. ?Sunken eyes. ?Sleepiness. ?Weakness. ?These symptoms may be an emergency. Get  help right away. Call 911. ?Do not wait to see if the symptoms will go away. ?Do not drive yourself to the hospital. ?Summary ?Nausea is feeling that you have an upset stomach and that you are about to vomit. Vomiting is when food in your stomach comes out of your mouth. ?Follow instructions from your doctor about eating and drinking. ?Take over-the-counter and prescription medicines only as told by your doctor. ?Contact your doctor if your symptoms get worse or you have new symptoms. ?Keep all follow-up visits. ?This information is not intended to replace advice given to you by your health care provider. Make sure you discuss any questions you have with your health care provider. ?Document Revised: 10/27/2020 Document Reviewed: 10/27/2020 ?Elsevier Patient Education ? 2023 Elsevier Inc. ? ?

## 2021-09-04 NOTE — Progress Notes (Deleted)
? ?Virtual Visit via Video Note  ? ?This visit type was conducted due to national recommendations for restrictions regarding the COVID-19 Pandemic (e.g. social distancing) in an effort to limit this patient's exposure and mitigate transmission in our community.  Due to her co-morbid illnesses, this patient is at least at moderate risk for complications without adequate follow up.  This format is felt to be most appropriate for this patient at this time.  All issues noted in this document were discussed and addressed.  A limited physical exam was performed with this format.  A verbal consent was obtained for the virtual visit.  ? ?Date:  09/04/2021  ? ?ID:  Bailey Serrano, DOB 02/08/2001, MRN [id number] ? ?{Patient Location:[phone]::"Home"} ?{Provider Location:[phone]::"Home Office"} ? ?PCP:  Rip Harbour, NP  ? ?Evaluation Performed:  {Choose Visit [id number] Visit"} ? ?Chief Complaint:  *** ? ?History of Present Illness:   ? ?Bailey Serrano is a 21 y.o. female with *** ? ?The patient {does/does not:200015} have symptoms concerning for COVID-19 infection (fever, chills, cough, or new shortness of breath).  ? ? ?Past Medical History:  ?Diagnosis Date  ? Patellofemoral syndrome   ? Tendonitis   ? ? ?Past Surgical History:  ?Procedure Laterality Date  ? GALLBLADDER SURGERY    ? ? ?Family History  ?Adopted: Yes  ?Problem Relation Age of Onset  ? Hypertension Mother   ? Muscular dystrophy Brother   ? COPD Maternal Grandmother   ? Diabetes Other   ? Aneurysm Other   ? ? ?Social History  ? ?Socioeconomic History  ? Marital status: Single  ?  Spouse name: Not on file  ? Number of children: Not on file  ? Years of education: Not on file  ? Highest education level: Not on file  ?Occupational History  ? Not on file  ?Tobacco Use  ? Smoking status: Never  ? Smokeless tobacco: Never  ?Vaping Use  ? Vaping Use: Every day  ? Substances: Nicotine, Flavoring  ?Substance and Sexual Activity  ? Alcohol use:  Never  ? Drug use: Never  ? Sexual activity: Yes  ?  Birth control/protection: Implant  ?Other Topics Concern  ? Not on file  ?Social History Narrative  ? Not on file  ? ?Social Determinants of Health  ? ?Financial Resource Strain: Not on file  ?Food Insecurity: Not on file  ?Transportation Needs: Not on file  ?Physical Activity: Not on file  ?Stress: Not on file  ?Social Connections: Not on file  ?Intimate Partner Violence: Not on file  ? ? ?Outpatient Medications Prior to Visit  ?Medication Sig Dispense Refill  ? etonogestrel (NEXPLANON) 68 MG IMPL implant 68 mg.    ? ondansetron (ZOFRAN) 4 MG tablet Take 1 tablet (4 mg total) by mouth every 8 (eight) hours as needed for nausea or vomiting. 20 tablet 0  ? phentermine (ADIPEX-P) 37.5 MG tablet Take 1 tablet (37.5 mg total) by mouth daily before breakfast. 30 tablet 0  ? Semaglutide-Weight Management (WEGOVY) 0.5 MG/0.5ML SOAJ Inject 0.5 mg into the skin once a week. 2 mL 0  ? Semaglutide-Weight Management 0.5 MG/0.5ML SOAJ Inject 0.5 mg into the skin once a week for 28 days. 2 mL 0  ? [START ON 09/08/2021] Semaglutide-Weight Management 1 MG/0.5ML SOAJ Inject 1 mg into the skin once a week for 28 days. 2 mL 0  ? [START ON 10/07/2021] Semaglutide-Weight Management 1.7 MG/0.75ML SOAJ Inject 1.7 mg into the skin once a week for 28 days. 3 mL  0  ? [START ON 11/05/2021] Semaglutide-Weight Management 2.4 MG/0.75ML SOAJ Inject 2.4 mg into the skin once a week for 28 days. 3 mL 3  ? ?No facility-administered medications prior to visit.  ? ? ?Allergies:   Patient has no known allergies.  ? ?Social History  ? ?Tobacco Use  ? Smoking status: Never  ? Smokeless tobacco: Never  ?Vaping Use  ? Vaping Use: Every day  ? Substances: Nicotine, Flavoring  ?Substance Use Topics  ? Alcohol use: Never  ? Drug use: Never  ?  ? ?ROS  ? ?Labs/Other Tests and Data Reviewed:   ? ?Recent Labs: ?02/07/2021: ALT 20; BUN 8; Creatinine, Ser 0.71; Hemoglobin 15.5; Platelets 337; Potassium 4.4; Sodium 142;  TSH 1.550  ? ?Recent Lipid Panel ?No results found for: CHOL, TRIG, HDL, CHOLHDL, LDLCALC, LDLDIRECT ? ?Wt Readings from Last 3 Encounters:  ?09/04/21 182 lb (82.6 kg)  ?07/12/21 192 lb (87.1 kg)  ?04/04/21 203 lb (92.1 kg)  ?  ? ?Objective:   ? ?Vital Signs:  BP 99/68   Pulse (!) 50   Ht 5' 5.5" (1.664 m)   Wt 182 lb (82.6 kg) Comment: per pt  BMI 29.83 kg/m?   ? ?Physical Exam  ? ?ASSESSMENT & PLAN:   ? ?Problem List Items Addressed This Visit   ?None ?. ? ?No orders of the defined types were placed in this encounter. ?  ? ?No orders of the defined types were placed in this encounter. ? ? ?COVID-19 Education: ?The signs and symptoms of COVID-19 were discussed with the patient and how to seek care for testing (follow up with PCP or arrange E-visit). The importance of social distancing was discussed today. ? ? ?I spent < time > minutes dedicated to the care of this patient on the date of this encounter to include face-to-face time with the patient, as well as: *** ? ?Follow Up:  {F/U Format:[id number]} {follow up:15908} ? ?Signed, ?Alonna Minium, [place]  ?09/04/2021 11:12 AM    ?[facility]  ?

## 2021-09-04 NOTE — Progress Notes (Signed)
? ?Acute Office Visit ? ?Subjective:  ? ?  ?Patient ID: Bailey Serrano, female    DOB: May 21, 2000, 21 y.o.   MRN: [id number] ? ?Chief Complaint  ?Patient presents with  ? vomiting  ? ? ?HPI: ?Bailey Serrano is a 21 year old Caucasian female that presents for evaluation of vomiting and nausea. Onset was one week ago. Treatment has included Zofran with minimal benefit. She denies pregnancy. Currently has Nexplanon implant for contraception, due for removal 12/2021. She is prescribed Wegovy 0.5 mg weekly injection and Phentermine 37.5 mg oral daily for weight management. States she has constipation for approximately 4 days.   ? ?Review of Systems  ?Constitutional:  Positive for malaise/fatigue.  ?Gastrointestinal:  Positive for constipation, nausea and vomiting.  ? ? ?   ?Objective:  ?  ?BP 104/72   Pulse (!) 113 Comment: 93-113  Temp 98.3 ?F (36.8 ?C)   Ht 5' 5.5" (1.664 m)   Wt 183 lb (83 kg)   SpO2 98%   BMI 29.99 kg/m?  ? ? ?Physical Exam ?Vitals reviewed.  ?Constitutional:   ?   Appearance: She is ill-appearing.  ?Cardiovascular:  ?   Rate and Rhythm: Tachycardia present.  ?Skin: ?   General: Skin is warm and dry.  ?   Capillary Refill: Capillary refill takes less than 2 seconds.  ?Neurological:  ?   General: No focal deficit present.  ?   Mental Status: She is oriented to person, place, and time.  ?Psychiatric:     ?   Mood and Affect: Mood normal.     ?   Behavior: Behavior normal.  ? ? ? ? ? ?   ?Assessment & Plan:  ? ?1. Nausea and vomiting, unspecified vomiting type ?- promethazine (PHENERGAN) 12.5 MG tablet; Take 1 tablet (12.5 mg total) by mouth every 6 (six) hours as needed for nausea or vomiting.  Dispense: 30 tablet; Refill: 1 ?- POCT urine pregnancy ? ?2. Drug-induced constipation ?- linaclotide (LINZESS) 72 MCG capsule; Take 1 capsule (72 mcg total) by mouth daily before breakfast.  Dispense: 4 capsule; Refill: 0 ? ?3. BMI 29.0-29.9,adult ?- Semaglutide-Weight Management (WEGOVY) 0.25 MG/0.5ML SOAJ;  Inject 0.25 mg into the skin once a week.  Dispense: 2 mL; Refill: 0 ? ?4. Obesity (BMI 30.0-34.9) ?- Semaglutide-Weight Management (WEGOVY) 0.25 MG/0.5ML SOAJ; Inject 0.25 mg into the skin once a week.  Dispense: 2 mL; Refill: 0 ?   ?Stop Adipex 37.5 mg ?Decrease Wegovy to 0.25 mg weekly for four weeks, then increase to 0.5 mg for 4 weeks ?High fiber diet ?Take Linzess 72 mcg once daily for four days  ?Recommend stool softener twice daily to prevent constipation ?Continue to push fluids ?Take Phenergan 12.5 mg as needed for nausea ?Follow-up June 13th, 2023 at 3:00 or sooner if needed ? ?Meds ordered this encounter  ?Medications  ? promethazine (PHENERGAN) 12.5 MG tablet  ?  Sig: Take 1 tablet (12.5 mg total) by mouth every 6 (six) hours as needed for nausea or vomiting.  ?  Dispense:  30 tablet  ?  Refill:  1  ?  Order Specific Question:   Supervising Provider  ?  AnswerBlane Ohara [[id number]]  ? Semaglutide-Weight Management (WEGOVY) 0.25 MG/0.5ML SOAJ  ?  Sig: Inject 0.25 mg into the skin once a week.  ?  Dispense:  2 mL  ?  Refill:  0  ?  Order Specific Question:   Supervising Provider  ?  AnswerBlane Ohara [[id number]]  ?  linaclotide (LINZESS) 72 MCG capsule  ?  Sig: Take 1 capsule (72 mcg total) by mouth daily before breakfast.  ?  Dispense:  4 capsule  ?  Refill:  0  ?  Order Specific Question:   Supervising Provider  ?  AnswerBlane Ohara [[id number]]  ? ?I, Janie Morning, NP, have reviewed all documentation for this visit. The documentation on 09/04/21 for the exam, diagnosis, procedures, and orders are all accurate and complete.  ? ?Follow-up:  ? ?Return if symptoms worsen or fail to improve, for 10/16/21 at 3:00 . ? ? ?Signed, ?Janie Morning, NP ? ? ?

## 2021-10-16 ENCOUNTER — Encounter: Payer: Self-pay | Admitting: Nurse Practitioner

## 2021-10-16 ENCOUNTER — Ambulatory Visit: Payer: BC Managed Care – PPO | Admitting: Nurse Practitioner

## 2021-10-16 VITALS — BP 112/74 | HR 89 | Temp 97.2°F | Ht 65.5 in | Wt 187.7 lb

## 2021-10-16 DIAGNOSIS — J018 Other acute sinusitis: Secondary | ICD-10-CM

## 2021-10-16 DIAGNOSIS — Z6830 Body mass index (BMI) 30.0-30.9, adult: Secondary | ICD-10-CM | POA: Diagnosis not present

## 2021-10-16 DIAGNOSIS — Z7689 Persons encountering health services in other specified circumstances: Secondary | ICD-10-CM | POA: Diagnosis not present

## 2021-10-16 DIAGNOSIS — E6609 Other obesity due to excess calories: Secondary | ICD-10-CM | POA: Diagnosis not present

## 2021-10-16 MED ORDER — WEGOVY 1 MG/0.5ML ~~LOC~~ SOAJ: 1.0000 mg | SUBCUTANEOUS | 0 refills | Status: DC

## 2021-10-16 MED ORDER — AZITHROMYCIN 250 MG PO TABS: ORAL_TABLET | ORAL | 0 refills | Status: AC

## 2021-10-16 MED ORDER — SEMAGLUTIDE(0.25 OR 0.5MG/DOS) 2 MG/1.5ML ~~LOC~~ SOPN: 0.5000 mg | PEN_INJECTOR | SUBCUTANEOUS | 0 refills | Status: DC

## 2021-10-16 NOTE — Progress Notes (Signed)
Subjective:  Patient ID: Bailey Serrano, female    DOB: October 12, 2000  Age: 21 y.o. MRN: [id number]  Chief Complaint  Patient presents with   Weight Management Screening   Nausea    HPI   Bailey Serrano is a 21 year old Caucasian female that presents to follow-up on weight management. Current BMI 30.76. She has lost 16 lbs since 03/2021.    Pt has URI symptoms, for 4 days. Rhinorrhea, sore throat., sinus congestion . Treatment has included Allegra, Sudafed, and Dayquil/Nyquil, Flonase   Current Outpatient Medications on File Prior to Visit  Medication Sig Dispense Refill   etonogestrel (NEXPLANON) 68 MG IMPL implant 68 mg.     linaclotide (LINZESS) 72 MCG capsule Take 1 capsule (72 mcg total) by mouth daily before breakfast. 4 capsule 0   ondansetron (ZOFRAN) 4 MG tablet Take 1 tablet (4 mg total) by mouth every 8 (eight) hours as needed for nausea or vomiting. 20 tablet 0   promethazine (PHENERGAN) 12.5 MG tablet Take 1 tablet (12.5 mg total) by mouth every 6 (six) hours as needed for nausea or vomiting. 30 tablet 1   Semaglutide-Weight Management (WEGOVY) 0.25 MG/0.5ML SOAJ Inject 0.25 mg into the skin once a week. (Patient not taking: Reported on 10/16/2021) 2 mL 0   Semaglutide-Weight Management (WEGOVY) 0.5 MG/0.5ML SOAJ Inject 0.5 mg into the skin once a week. 2 mL 0   Semaglutide-Weight Management 1.7 MG/0.75ML SOAJ Inject 1.7 mg into the skin once a week for 28 days. (Patient not taking: Reported on 10/16/2021) 3 mL 0   [START ON 11/05/2021] Semaglutide-Weight Management 2.4 MG/0.75ML SOAJ Inject 2.4 mg into the skin once a week for 28 days. (Patient not taking: Reported on 10/16/2021) 3 mL 3   No current facility-administered medications on file prior to visit.   Past Medical History:  Diagnosis Date   Patellofemoral syndrome    Tendonitis    Past Surgical History:  Procedure Laterality Date   GALLBLADDER SURGERY      Family History  Adopted: Yes  Problem Relation Age of Onset    Hypertension Mother    Muscular dystrophy Brother    COPD Maternal Grandmother    Diabetes Other    Aneurysm Other    Social History   Socioeconomic History   Marital status: Single    Spouse name: Not on file   Number of children: Not on file   Years of education: Not on file   Highest education level: Not on file  Occupational History   Not on file  Tobacco Use   Smoking status: Never   Smokeless tobacco: Never  Vaping Use   Vaping Use: Every day   Substances: Nicotine, Flavoring  Substance and Sexual Activity   Alcohol use: Never   Drug use: Never   Sexual activity: Yes    Birth control/protection: Implant  Other Topics Concern   Not on file  Social History Narrative   Not on file   Social Determinants of Health   Financial Resource Strain: Not on file  Food Insecurity: Not on file  Transportation Needs: Not on file  Physical Activity: Not on file  Stress: Not on file  Social Connections: Not on file    Review of Systems  Constitutional:  Positive for fatigue. Negative for chills and fever.  HENT:  Positive for congestion, postnasal drip, rhinorrhea, sinus pressure, sinus pain and sore throat.   Respiratory:  Positive for cough.   Cardiovascular:  Negative for chest pain.  Objective:  BP 112/74   Pulse 89   Temp (!) 97.2 F (36.2 C)   Ht 5' 5.5" (1.664 m)   Wt 187 lb 11.2 oz (85.1 kg)   SpO2 99%   BMI 30.76 kg/m      10/16/2021    3:01 PM 09/04/2021    1:32 PM 09/04/2021   11:10 AM  BP/Weight  Systolic BP 112 104 99  Diastolic BP 74 72 68  Wt. (Lbs) 187.7 183 182  BMI 30.76 kg/m2 29.99 kg/m2 29.83 kg/m2    Physical Exam Vitals reviewed.  Constitutional:      Appearance: She is obese.  HENT:     Nose: Congestion and rhinorrhea present.     Mouth/Throat:     Pharynx: Posterior oropharyngeal erythema present.  Cardiovascular:     Rate and Rhythm: Normal rate and regular rhythm.     Pulses: Normal pulses.     Heart sounds: Normal  heart sounds.  Pulmonary:     Effort: Pulmonary effort is normal.     Breath sounds: Normal breath sounds.  Lymphadenopathy:     Cervical: Cervical adenopathy present.  Skin:    Capillary Refill: Capillary refill takes less than 2 seconds.  Neurological:     Mental Status: She is alert.         Lab Results  Component Value Date   WBC 11.3 (H) 02/07/2021   HGB 15.5 02/07/2021   HCT 46.7 (H) 02/07/2021   PLT 337 02/07/2021   GLUCOSE 79 02/07/2021   ALT 20 02/07/2021   AST 19 02/07/2021   NA 142 02/07/2021   K 4.4 02/07/2021   CL 104 02/07/2021   CREATININE 0.71 02/07/2021   BUN 8 02/07/2021   CO2 19 (L) 02/07/2021   TSH 1.550 02/07/2021      Assessment & Plan:   1. Acute non-recurrent sinusitis of other sinus - azithromycin (ZITHROMAX) 250 MG tablet; Take 2 tablets on day 1, then 1 tablet daily on days 2 through 5  Dispense: 6 tablet; Refill: 0  2. Class 1 obesity due to excess calories without serious comorbidity with body mass index (BMI) of 30.0 to 30.9 in adult - Semaglutide,0.25 or 0.5MG /DOS, 2 MG/1.5ML SOPN; Inject 0.5 mg into the skin once a week.  Dispense: 3 mL; Refill: 0 - Semaglutide-Weight Management (WEGOVY) 1 MG/0.5ML SOAJ; Inject 1 mg into the skin once a week.  Dispense: 2 mL; Refill: 0  3. Encounter for weight management - Semaglutide,0.25 or 0.5MG /DOS, 2 MG/1.5ML SOPN; Inject 0.5 mg into the skin once a week.  Dispense: 3 mL; Refill: 0 - Semaglutide-Weight Management (WEGOVY) 1 MG/0.5ML SOAJ; Inject 1 mg into the skin once a week.  Dispense: 2 mL; Refill: 0     Continue Semaglutide 0.5 mg injection weekly for 2 more weeks, increase to 1 mg injection for 4 weeks Continue sinus medication Take Z-pack as directed Follow-up in 3 months     Follow-up: 23-months  An After Visit Summary was printed and given to the patient.  Janie Morning, NP [facility] [phone]

## 2021-10-16 NOTE — Patient Instructions (Signed)
Continue Semaglutide 0.5 mg injection weekly for 2 more weeks, increase to 1 mg injection for 4 weeks Continue sinus medication Take Z-pack as directed Follow-up in 3 months    Semaglutide Injection (Weight Management) What is this medication? SEMAGLUTIDE (SEM a GLOO tide) promotes weight loss. It may also be used to maintain weight loss. It works by decreasing appetite. Changes to diet and exercise are often combined with this medication. This medicine may be used for other purposes; ask your health care provider or pharmacist if you have questions. COMMON BRAND NAME(S): [id number] What should I tell my care team before I take this medication? They need to know if you have any of these conditions: Endocrine tumors (MEN 2) or if someone in your family had these tumors Eye disease, vision problems Gallbladder disease History of depression or mental health disease History of pancreatitis Kidney disease Stomach or intestine problems Suicidal thoughts, plans, or attempt; a previous suicide attempt by you or a family member Thyroid cancer or if someone in your family had thyroid cancer An unusual or allergic reaction to semaglutide, other medications, foods, dyes, or preservatives Pregnant or trying to get pregnant Breast-feeding How should I use this medication? This medication is injected under the skin. You will be taught how to prepare and give it. Take it as directed on the prescription label. It is given once every week (every 7 days). Keep taking it unless your care team tells you to stop. It is important that you put your used needles and pens in a special sharps container. Do not put them in a trash can. If you do not have a sharps container, call your pharmacist or care team to get one. A special MedGuide will be given to you by the pharmacist with each prescription and refill. Be sure to read this information carefully each time. This medication comes with INSTRUCTIONS FOR USE. Ask  your pharmacist for directions on how to use this medication. Read the information carefully. Talk to your pharmacist or care team if you have questions. Talk to your care team about the use of this medication in children. While it may be prescribed for children as young as 12 years for selected conditions, precautions do apply. Overdosage: If you think you have taken too much of this medicine contact a poison control center or emergency room at once. NOTE: This medicine is only for you. Do not share this medicine with others. What if I miss a dose? If you miss a dose and the next scheduled dose is more than 2 days away, take the missed dose as soon as possible. If you miss a dose and the next scheduled dose is less than 2 days away, do not take the missed dose. Take the next dose at your regular time. Do not take double or extra doses. If you miss your dose for 2 weeks or more, take the next dose at your regular time or call your care team to talk about how to restart this medication. What may interact with this medication? Insulin and other medications for diabetes This list may not describe all possible interactions. Give your health care provider a list of all the medicines, herbs, non-prescription drugs, or dietary supplements you use. Also tell them if you smoke, drink alcohol, or use illegal drugs. Some items may interact with your medicine. What should I watch for while using this medication? Visit your care team for regular checks on your progress. It may be some time before you  see the benefit from this medication. Drink plenty of fluids while taking this medication. Check with your care team if you have severe diarrhea, nausea, and vomiting, or if you sweat a lot. The loss of too much body fluid may make it dangerous for you to take this medication. This medication may affect blood sugar levels. Ask your care team if changes in diet or medications are needed if you have diabetes. If you or  your family notice any changes in your behavior, such as new or worsening depression, thoughts of harming yourself, anxiety, other unusual or disturbing thoughts, or memory loss, call your care team right away. Women should inform their care team if they wish to become pregnant or think they might be pregnant. Losing weight while pregnant is not advised and may cause harm to the unborn child. Talk to your care team for more information. What side effects may I notice from receiving this medication? Side effects that you should report to your care team as soon as possible: Allergic reactions--skin rash, itching, hives, swelling of the face, lips, tongue, or throat Change in vision Dehydration--increased thirst, dry mouth, feeling faint or lightheaded, headache, dark yellow or brown urine Gallbladder problems--severe stomach pain, nausea, vomiting, fever Heart palpitations--rapid, pounding, or irregular heartbeat Kidney injury--decrease in the amount of urine, swelling of the ankles, hands, or feet Pancreatitis--severe stomach pain that spreads to your back or gets worse after eating or when touched, fever, nausea, vomiting Thoughts of suicide or self-harm, worsening mood, feelings of depression Thyroid cancer--new mass or lump in the neck, pain or trouble swallowing, trouble breathing, hoarseness Side effects that usually do not require medical attention (report to your care team if they continue or are bothersome): Diarrhea Loss of appetite Nausea Stomach pain Vomiting This list may not describe all possible side effects. Call your doctor for medical advice about side effects. You may report side effects to FDA at 1-800-FDA-1088. Where should I keep my medication? Keep out of the reach of children and pets. Refrigeration (preferred): Store in the refrigerator. Do not freeze. Keep this medication in the original container until you are ready to take it. Get rid of any unused medication after the  expiration date. Room temperature: If needed, prior to cap removal, the pen can be stored at room temperature for up to 28 days. Protect from light. If it is stored at room temperature, get rid of any unused medication after 28 days or after it expires, whichever is first. It is important to get rid of the medication as soon as you no longer need it or it is expired. You can do this in two ways: Take the medication to a medication take-back program. Check with your pharmacy or law enforcement to find a location. If you cannot return the medication, follow the directions in the [place]. NOTE: This sheet is a summary. It may not cover all possible information. If you have questions about this medicine, talk to your doctor, pharmacist, or health care provider.  2023 Elsevier/Gold Standard (2021-05-09 00:00:00)   Sinus Infection, Adult A sinus infection is soreness and swelling (inflammation) of your sinuses. Sinuses are hollow spaces in the bones around your face. They are located: Around your eyes. In the middle of your forehead. Behind your nose. In your cheekbones. Your sinuses and nasal passages are lined with a fluid called mucus. Mucus drains out of your sinuses. Swelling can trap mucus in your sinuses. This lets germs (bacteria, virus, or fungus) grow, which leads  to infection. Most of the time, this condition is caused by a virus. What are the causes? Allergies. Asthma. Germs. Things that block your nose or sinuses. Growths in the nose (nasal polyps). Chemicals or irritants in the air. A fungus. This is rare. What increases the risk? Having a weak body defense system (immune system). Doing a lot of swimming or diving. Using nasal sprays too much. Smoking. What are the signs or symptoms? The main symptoms of this condition are pain and a feeling of pressure around the sinuses. Other symptoms include: Stuffy nose (congestion). This may make it hard to breathe through your  nose. Runny nose (drainage). Soreness, swelling, and warmth in the sinuses. A cough that may get worse at night. Being unable to smell and taste. Mucus that collects in the throat or the back of the nose (postnasal drip). This may cause a sore throat or bad breath. Being very tired (fatigued). A fever. How is this diagnosed? Your symptoms. Your medical history. A physical exam. Tests to find out if your condition is short-term (acute) or long-term (chronic). Your doctor may: Check your nose for growths (polyps). Check your sinuses using a tool that has a light on one end (endoscope). Check for allergies or germs. Do imaging tests, such as an MRI or CT scan. How is this treated? Treatment for this condition depends on the cause and whether it is short-term or long-term. If caused by a virus, your symptoms should go away on their own within 10 days. You may be given medicines to relieve symptoms. They include: Medicines that shrink swollen tissue in the nose. A spray that treats swelling of the nostrils. Rinses that help get rid of thick mucus in your nose (nasal saline washes). Medicines that treat allergies (antihistamines). Over-the-counter pain relievers. If caused by bacteria, your doctor may wait to see if you will get better without treatment. You may be given antibiotic medicine if you have: A very bad infection. A weak body defense system. If caused by growths in the nose, surgery may be needed. Follow these instructions at home: Medicines Take, use, or apply over-the-counter and prescription medicines only as told by your doctor. These may include nasal sprays. If you were prescribed an antibiotic medicine, take it as told by your doctor. Do not stop taking it even if you start to feel better. Hydrate and humidify  Drink enough water to keep your pee (urine) pale yellow. Use a cool mist humidifier to keep the humidity level in your home above 50%. Breathe in steam for  10-15 minutes, 3-4 times a day, or as told by your doctor. You can do this in the bathroom while a hot shower is running. Try not to spend time in cool or dry air. Rest Rest as much as you can. Sleep with your head raised (elevated). Make sure you get enough sleep each night. General instructions  Put a warm, moist washcloth on your face 3-4 times a day, or as often as told by your doctor. Use nasal saline washes as often as told by your doctor. Wash your hands often with soap and water. If you cannot use soap and water, use hand sanitizer. Do not smoke. Avoid being around people who are smoking (secondhand smoke). Keep all follow-up visits. Contact a doctor if: You have a fever. Your symptoms get worse. Your symptoms do not get better within 10 days. Get help right away if: You have a very bad headache. You cannot stop vomiting. You have  very bad pain or swelling around your face or eyes. You have trouble seeing. You feel confused. Your neck is stiff. You have trouble breathing. These symptoms may be an emergency. Get help right away. Call 911. Do not wait to see if the symptoms will go away. Do not drive yourself to the hospital. Summary A sinus infection is swelling of your sinuses. Sinuses are hollow spaces in the bones around your face. This condition is caused by tissues in your nose that become inflamed or swollen. This traps germs. These can lead to infection. If you were prescribed an antibiotic medicine, take it as told by your doctor. Do not stop taking it even if you start to feel better. Keep all follow-up visits. This information is not intended to replace advice given to you by your health care provider. Make sure you discuss any questions you have with your health care provider. Document Revised: 03/27/2021 Document Reviewed: 03/27/2021 Elsevier Patient Education  [place].

## 2021-12-14 ENCOUNTER — Ambulatory Visit: Payer: BC Managed Care – PPO | Admitting: Obstetrics

## 2021-12-14 ENCOUNTER — Encounter: Payer: Self-pay | Admitting: Obstetrics

## 2021-12-14 VITALS — BP 126/84 | Ht 65.0 in | Wt 191.0 lb

## 2021-12-14 DIAGNOSIS — Z3046 Encounter for surveillance of implantable subdermal contraceptive: Secondary | ICD-10-CM

## 2021-12-14 NOTE — Progress Notes (Signed)
  GYNECOLOGY PROCEDURE NOTE  Bailey Serrano presents for Nexplanon removal. She and her partner are considering pregnancy over the next year. Her implant has been in place for 3 years. She does not want any other bc at this time.  Nexplanon removal discussed in detail.  Risks of infection, bleeding, nerve injury all reviewed.  Patient understands risks and desires to proceed.  Verbal consent obtained.  Patient is certain she wants the Nexplanon removed.  All questions answered.  Procedure: Patient placed in dorsal supine with left arm above head, elbow flexed at 90 degrees, arm resting on examination table.  Nexplanon identified without problems.  Betadine scrub x3.  1 ml of 1% lidocaine injected under Nexplanondevice without problems.  Sterile gloves applied.  Small 0.5cm incision made at distal tip of Nexplanon device with 11 blade scalpel.  Nexplanon brought to incision and grasped with a small kelly clamp.  Nexplanon removed intact without problems.  Pressure applied to incision.  Hemostasis obtained.  Steri-strips applied, followed by bandage and compression dressing.  Patient tolerated procedure well.  No complications.   Assessment: 21 y.o. year old female now s/p uncomplicated Nexplanon removal.  Plan: 1.  Patient given post procedure precautions and asked to call for fever, chills, redness or drainage from her incision, bleeding from incision.  She understands she will likely have a small bruise near site of removal and can remove bandage tomorrow and steri-strips in approximately 1 week.    Bailey Serrano, CNM  12/14/2021 4:41 PM

## 2021-12-20 ENCOUNTER — Encounter: Payer: Self-pay | Admitting: Obstetrics

## 2022-01-23 ENCOUNTER — Encounter: Payer: Self-pay | Admitting: Nurse Practitioner

## 2022-01-23 ENCOUNTER — Telehealth: Payer: Self-pay | Admitting: Nurse Practitioner

## 2022-01-23 ENCOUNTER — Ambulatory Visit: Payer: BC Managed Care – PPO | Admitting: Nurse Practitioner

## 2022-02-26 ENCOUNTER — Encounter: Payer: Self-pay | Admitting: Nurse Practitioner

## 2022-02-26 ENCOUNTER — Ambulatory Visit: Payer: BC Managed Care – PPO | Admitting: Nurse Practitioner

## 2022-02-26 VITALS — BP 126/68 | HR 105 | Temp 97.2°F | Ht 65.0 in | Wt 201.0 lb

## 2022-02-26 DIAGNOSIS — R000 Tachycardia, unspecified: Secondary | ICD-10-CM | POA: Diagnosis not present

## 2022-02-26 DIAGNOSIS — F418 Other specified anxiety disorders: Secondary | ICD-10-CM | POA: Diagnosis not present

## 2022-02-26 DIAGNOSIS — Z8249 Family history of ischemic heart disease and other diseases of the circulatory system: Secondary | ICD-10-CM

## 2022-02-26 DIAGNOSIS — E6609 Other obesity due to excess calories: Secondary | ICD-10-CM

## 2022-02-26 DIAGNOSIS — Z6833 Body mass index (BMI) 33.0-33.9, adult: Secondary | ICD-10-CM

## 2022-02-26 NOTE — Patient Instructions (Addendum)
We will call you with lab results and cardiology referral  Return in 2-weeks to discuss anxiety and depression medication  Calorie Counting for Weight Loss Calories are units of energy. Your body needs a certain number of calories from food to keep going throughout the day. When you eat or drink more calories than your body needs, your body stores the extra calories mostly as fat. When you eat or drink fewer calories than your body needs, your body burns fat to get the energy it needs. Calorie counting means keeping track of how many calories you eat and drink each day. Calorie counting can be helpful if you need to lose weight. If you eat fewer calories than your body needs, you should lose weight. Ask your health care provider what a healthy weight is for you. For calorie counting to work, you will need to eat the right number of calories each day to lose a healthy amount of weight per week. A dietitian can help you figure out how many calories you need in a day and will suggest ways to reach your calorie goal. A healthy amount of weight to lose each week is usually 1-2 lb (0.5-0.9 kg). This usually means that your daily calorie intake should be reduced by 500-750 calories. Eating 1,200-1,500 calories a day can help most women lose weight. Eating 1,500-1,800 calories a day can help most men lose weight. What do I need to know about calorie counting? Work with your health care provider or dietitian to determine how many calories you should get each day. To meet your daily calorie goal, you will need to: Find out how many calories are in each food that you would like to eat. Try to do this before you eat. Decide how much of the food you plan to eat. Keep a food log. Do this by writing down what you ate and how many calories it had. To successfully lose weight, it is important to balance calorie counting with a healthy lifestyle that includes regular activity. Where do I find calorie  information?  The number of calories in a food can be found on a Nutrition Facts label. If a food does not have a Nutrition Facts label, try to look up the calories online or ask your dietitian for help. Remember that calories are listed per serving. If you choose to have more than one serving of a food, you will have to multiply the calories per serving by the number of servings you plan to eat. For example, the label on a package of bread might say that a serving size is 1 slice and that there are 90 calories in a serving. If you eat 1 slice, you will have eaten 90 calories. If you eat 2 slices, you will have eaten 180 calories. How do I keep a food log? After each time that you eat, record the following in your food log as soon as possible: What you ate. Be sure to include toppings, sauces, and other extras on the food. How much you ate. This can be measured in cups, ounces, or number of items. How many calories were in each food and drink. The total number of calories in the food you ate. Keep your food log near you, such as in a pocket-sized notebook or on an app or website on your mobile phone. Some programs will calculate calories for you and show you how many calories you have left to meet your daily goal. What are some portion-control tips? Know  how many calories are in a serving. This will help you know how many servings you can have of a certain food. Use a measuring cup to measure serving sizes. You could also try weighing out portions on a kitchen scale. With time, you will be able to estimate serving sizes for some foods. Take time to put servings of different foods on your favorite plates or in your favorite bowls and cups so you know what a serving looks like. Try not to eat straight from a food's packaging, such as from a bag or box. Eating straight from the package makes it hard to see how much you are eating and can lead to overeating. Put the amount you would like to eat in a cup  or on a plate to make sure you are eating the right portion. Use smaller plates, glasses, and bowls for smaller portions and to prevent overeating. Try not to multitask. For example, avoid watching TV or using your computer while eating. If it is time to eat, sit down at a table and enjoy your food. This will help you recognize when you are full. It will also help you be more mindful of what and how much you are eating. What are tips for following this plan? Reading food labels Check the calorie count compared with the serving size. The serving size may be smaller than what you are used to eating. Check the source of the calories. Try to choose foods that are high in protein, fiber, and vitamins, and low in saturated fat, trans fat, and sodium. Shopping Read nutrition labels while you shop. This will help you make healthy decisions about which foods to buy. Pay attention to nutrition labels for low-fat or fat-free foods. These foods sometimes have the same number of calories or more calories than the full-fat versions. They also often have added sugar, starch, or salt to make up for flavor that was removed with the fat. Make a grocery list of lower-calorie foods and stick to it. Cooking Try to cook your favorite foods in a healthier way. For example, try baking instead of frying. Use low-fat dairy products. Meal planning Use more fruits and vegetables. One-half of your plate should be fruits and vegetables. Include lean proteins, such as chicken, [place], and fish. Lifestyle Each week, aim to do one of the following: 150 minutes of moderate exercise, such as walking. 75 minutes of vigorous exercise, such as running. General information Know how many calories are in the foods you eat most often. This will help you calculate calorie counts faster. Find a way of tracking calories that works for you. Get creative. Try different apps or programs if writing down calories does not work for you. What  foods should I eat?  Eat nutritious foods. It is better to have a nutritious, high-calorie food, such as an avocado, than a food with few nutrients, such as a bag of potato chips. Use your calories on foods and drinks that will fill you up and will not leave you hungry soon after eating. Examples of foods that fill you up are nuts and nut butters, vegetables, lean proteins, and high-fiber foods such as whole grains. High-fiber foods are foods with more than 5 g of fiber per serving. Pay attention to calories in drinks. Low-calorie drinks include water and unsweetened drinks. The items listed above may not be a complete list of foods and beverages you can eat. Contact a dietitian for more information. What foods should I limit? Limit  foods or drinks that are not good sources of vitamins, minerals, or protein or that are high in unhealthy fats. These include: Candy. Other sweets. Sodas, specialty coffee drinks, alcohol, and juice. The items listed above may not be a complete list of foods and beverages you should avoid. Contact a dietitian for more information. How do I count calories when eating out? Pay attention to portions. Often, portions are much larger when eating out. Try these tips to keep portions smaller: Consider sharing a meal instead of getting your own. If you get your own meal, eat only half of it. Before you start eating, ask for a container and put half of your meal into it. When available, consider ordering smaller portions from the menu instead of full portions. Pay attention to your food and drink choices. Knowing the way food is cooked and what is included with the meal can help you eat fewer calories. If calories are listed on the menu, choose the lower-calorie options. Choose dishes that include vegetables, fruits, whole grains, low-fat dairy products, and lean proteins. Choose items that are boiled, broiled, grilled, or steamed. Avoid items that are buttered, battered,  fried, or served with cream sauce. Items labeled as crispy are usually fried, unless stated otherwise. Choose water, low-fat milk, unsweetened iced tea, or other drinks without added sugar. If you want an alcoholic beverage, choose a lower-calorie option, such as a glass of wine or light beer. Ask for dressings, sauces, and syrups on the side. These are usually high in calories, so you should limit the amount you eat. If you want a salad, choose a garden salad and ask for grilled meats. Avoid extra toppings such as bacon, cheese, or fried items. Ask for the dressing on the side, or ask for olive oil and vinegar or lemon to use as dressing. Estimate how many servings of a food you are given. Knowing serving sizes will help you be aware of how much food you are eating at restaurants. Where to find more information Centers for Disease Control and Prevention: FootballExhibition.com.br U.[place]. Department of Agriculture: WrestlingReporter.dk Summary Calorie counting means keeping track of how many calories you eat and drink each day. If you eat fewer calories than your body needs, you should lose weight. A healthy amount of weight to lose per week is usually 1-2 lb (0.5-0.9 kg). This usually means reducing your daily calorie intake by 500-750 calories. The number of calories in a food can be found on a Nutrition Facts label. If a food does not have a Nutrition Facts label, try to look up the calories online or ask your dietitian for help. Use smaller plates, glasses, and bowls for smaller portions and to prevent overeating. Use your calories on foods and drinks that will fill you up and not leave you hungry shortly after a meal. This information is not intended to replace advice given to you by your health care provider. Make sure you discuss any questions you have with your health care provider. Document Revised: 06/03/2019 Document Reviewed: 06/03/2019 Elsevier Patient Education  2023 Elsevier Inc.    Exercising to [employer] Getting regular exercise is important for everyone. It is especially important if you are overweight. Being overweight increases your risk of heart disease, stroke, diabetes, high blood pressure, and several types of cancer. Exercising, and reducing the calories you consume, can help you lose weight and improve fitness and health. Exercise can be moderate or vigorous intensity. To lose weight, most people need to do  a certain amount of moderate or vigorous-intensity exercise each week. How can exercise affect me? You lose weight when you exercise enough to burn more calories than you eat. Exercise also reduces body fat and builds muscle. The more muscle you have, the more calories you burn. Exercise also: Improves mood. Reduces stress and tension. Improves your overall fitness, flexibility, and endurance. Increases bone strength. Moderate-intensity exercise  Moderate-intensity exercise is any activity that gets you moving enough to burn at least three times more energy (calories) than if you were sitting. Examples of moderate exercise include: Walking a mile in 15 minutes. Doing light yard work. Biking at an easy pace. Most people should get at least 150 minutes of moderate-intensity exercise a week to maintain their body weight. Vigorous-intensity exercise Vigorous-intensity exercise is any activity that gets you moving enough to burn at least six times more calories than if you were sitting. When you exercise at this intensity, you should be working hard enough that you are not able to carry on a conversation. Examples of vigorous exercise include: Running. Playing a team sport, such as football, basketball, and soccer. Jumping rope. Most people should get at least 75 minutes a week of vigorous exercise to maintain their body weight. What actions can I take to lose weight? The amount of exercise you need to lose weight depends on: Your age. The type of exercise. Any health  conditions you have. Your overall physical ability. Talk to your health care provider about how much exercise you need and what types of activities are safe for you. Nutrition  Make changes to your diet as told by your health care provider or diet and nutrition specialist (dietitian). This may include: Eating fewer calories. Eating more protein. Eating less unhealthy fats. Eating a diet that includes fresh fruits and vegetables, whole grains, low-fat dairy products, and lean protein. Avoiding foods with added fat, salt, and sugar. Drink plenty of water while you exercise to prevent dehydration or heat stroke. Activity Choose an activity that you enjoy and set realistic goals. Your health care provider can help you make an exercise plan that works for you. Exercise at a moderate or vigorous intensity most days of the week. The intensity of exercise may vary from person to person. You can tell how intense a workout is for you by paying attention to your breathing and heartbeat. Most people will notice their breathing and heartbeat get faster with more intense exercise. Do resistance training twice each week, such as: Push-ups. Sit-ups. Lifting weights. Using resistance bands. Getting short amounts of exercise can be just as helpful as long, structured periods of exercise. If you have trouble finding time to exercise, try doing these things as part of your daily routine: Get up, stretch, and walk around every 30 minutes throughout the day. Go for a walk during your lunch break. Park your car farther away from your destination. If you take public transportation, get off one stop early and walk the rest of the way. Make phone calls while standing up and walking around. Take the stairs instead of elevators or escalators. Wear comfortable clothes and shoes with good support. Do not exercise so much that you hurt yourself, feel dizzy, or get very short of breath. Where to find more  information U.S. Department of Health and Human Services: ThisPath.fi Centers for Disease Control and Prevention: FootballExhibition.com.br Contact a health care provider: Before starting a new exercise program. If you have questions or concerns about your weight. If you have  a medical problem that keeps you from exercising. Get help right away if: You have any of the following while exercising: Injury. Dizziness. Difficulty breathing or shortness of breath that does not go away when you stop exercising. Chest pain. Rapid heartbeat. These symptoms may represent a serious problem that is an emergency. Do not wait to see if the symptoms will go away. Get medical help right away. Call your local emergency services (911 in the [place] Do not drive yourself to the hospital. Summary Getting regular exercise is especially important if you are overweight. Being overweight increases your risk of heart disease, stroke, diabetes, high blood pressure, and several types of cancer. Losing weight happens when you burn more calories than you eat. Reducing the amount of calories you eat, and getting regular moderate or vigorous exercise each week, helps you lose weight. This information is not intended to replace advice given to you by your health care provider. Make sure you discuss any questions you have with your health care provider. Document Revised: 06/18/2020 Document Reviewed: 06/18/2020 Elsevier Patient Education  2023 [employer].

## 2022-02-26 NOTE — Progress Notes (Signed)
Subjective:  Patient ID: Bailey Serrano, female    DOB: 2000-05-18  Age: 21 y.o. MRN: [id number]  Chief Complaint  Patient presents with   Weight Management F/U    HPI   Bailey Serrano is a 21 year old Caucasian female that presents to follow-up on weight management. Current weight 201 lbs, BMI 33.45. She has lost 16 lbs since 03/2021. She has not been taking wegovy since her last visit due to supply chain issues and intolerant to GI side effects. She is not currently exercising due to time constraints. States she eats a healthy diet. She was previously prescribed Adipex. Medication was discontinued for chronic tachycardia.   Bailey Serrano has chronic tachycardia. States she has intermittent dyspnea, chest tightness that she describes as "posterior lung pain" and dizziness. Denies history of syncope. Currently vapes nicotine daily. States she was adopted, does not know her family history on father's side. Reports family history on mother's side of hypertension and brain aneurysms.   She has positive screenings for mild anxiety, depression, and ADHD. She denies previous diagnosis or treatment. She has not attended counseling.  No current outpatient medications on file prior to visit.   No current facility-administered medications on file prior to visit.   Past Medical History:  Diagnosis Date   Patellofemoral syndrome    Tendonitis    Past Surgical History:  Procedure Laterality Date   GALLBLADDER SURGERY      Family History  Adopted: Yes  Problem Relation Age of Onset   Hypertension Mother    Muscular dystrophy Brother    COPD Maternal Grandmother    Diabetes Other    Aneurysm Other    Social History   Socioeconomic History   Marital status: Single    Spouse name: Not on file   Number of children: Not on file   Years of education: Not on file   Highest education level: Not on file  Occupational History   Not on file  Tobacco Use   Smoking status: Never   Smokeless  tobacco: Never  Vaping Use   Vaping Use: Every day   Substances: Nicotine, Flavoring  Substance and Sexual Activity   Alcohol use: Never   Drug use: Never   Sexual activity: Yes    Birth control/protection: Implant  Other Topics Concern   Not on file  Social History Narrative   Not on file   Social Determinants of Health   Financial Resource Strain: Low Risk  (02/26/2022)   Overall Financial Resource Strain (CARDIA)    Difficulty of Paying Living Expenses: Not hard at all  Food Insecurity: No Food Insecurity (02/26/2022)   Hunger Vital Sign    Worried About Running Out of Food in the Last Year: Never true    Ran Out of Food in the Last Year: Never true  Transportation Needs: No Transportation Needs (02/26/2022)   PRAPARE - Hydrologist (Medical): No    Lack of Transportation (Non-Medical): No  Physical Activity: Inactive (02/26/2022)   Exercise Vital Sign    Days of Exercise per Week: 0 days    Minutes of Exercise per Session: 0 min  Stress: No Stress Concern Present (02/26/2022)   Bailey Serrano    Feeling of Stress : Not at all  Social Connections: Moderately Isolated (02/26/2022)   Social Connection and Isolation Panel [NHANES]    Frequency of Communication with Friends and Family: More than three times a week    Frequency  of Social Gatherings with Friends and Family: More than three times a week    Attends Religious Services: Never    Marine scientist or Organizations: No    Attends Archivist Meetings: Never    Marital Status: Living with partner    Review of Systems  Constitutional:  Negative for chills, fatigue and fever.  HENT:  Negative for congestion, ear pain, rhinorrhea and sore throat.   Respiratory:  Positive for shortness of breath. Negative for cough.   Cardiovascular:  Negative for chest pain.  Gastrointestinal:  Negative for abdominal pain,  constipation, diarrhea, nausea and vomiting.  Endocrine: Positive for heat intolerance.  Genitourinary:  Negative for dysuria and urgency.  Musculoskeletal:  Negative for back pain and myalgias.  Neurological:  Positive for dizziness. Negative for weakness, light-headedness and headaches.  Psychiatric/Behavioral:  Negative for dysphoric mood. The patient is not nervous/anxious.      Objective:  BP 126/68   Pulse (!) 105   Temp (!) 97.2 F (36.2 C)   Ht 5\' 5"  (1.651 m)   Wt 201 lb (91.2 kg)   SpO2 95%   BMI 33.45 kg/m      02/26/2022    3:14 PM 12/14/2021    3:58 PM 10/16/2021    3:01 PM  BP/Weight  Systolic BP 123XX123 [id number]  Diastolic BP 68 84 74  Wt. (Lbs) 201 191 187.7  BMI 33.45 kg/m2 31.78 kg/m2 30.76 kg/m2    Physical Exam Vitals reviewed.  Constitutional:      Appearance: Normal appearance.  HENT:     Head: Normocephalic.     Right Ear: Tympanic membrane normal.     Left Ear: Tympanic membrane normal.     Nose: Nose normal.     Mouth/Throat:     Mouth: Mucous membranes are moist.  Eyes:     Pupils: Pupils are equal, round, and reactive to light.  Cardiovascular:     Rate and Rhythm: Regular rhythm. Tachycardia present.     Pulses: Normal pulses.     Heart sounds: Normal heart sounds.  Pulmonary:     Effort: Pulmonary effort is normal.     Breath sounds: Normal breath sounds.  Abdominal:     General: Bowel sounds are normal.     Palpations: Abdomen is soft.  Musculoskeletal:        General: Normal range of motion.  Skin:    General: Skin is warm and dry.     Capillary Refill: Capillary refill takes less than 2 seconds.  Neurological:     General: No focal deficit present.     Mental Status: She is alert and oriented to person, place, and time.  Psychiatric:        Mood and Affect: Mood normal.        Behavior: Behavior normal.    Lab Results  Component Value Date   WBC 11.3 (H) 02/07/2021   HGB 15.5 02/07/2021   HCT 46.7 (H) 02/07/2021   PLT  337 02/07/2021   GLUCOSE 79 02/07/2021   ALT 20 02/07/2021   AST 19 02/07/2021   NA 142 02/07/2021   K 4.4 02/07/2021   CL 104 02/07/2021   CREATININE 0.71 02/07/2021   BUN 8 02/07/2021   CO2 19 (L) 02/07/2021   TSH 1.550 02/07/2021      Assessment & Plan:   1. Tachycardia - Ambulatory referral to Cardiology - CBC with Differential/Platelet - Comprehensive metabolic panel - T4, free - TSH  2. Depression with anxiety - CBC with Differential/Platelet - Comprehensive metabolic panel - T4, free - TSH  3. Class 1 obesity due to excess calories without serious comorbidity with body mass index (BMI) of 33.0 to 33.9 in adult - CBC with Differential/Platelet - Comprehensive metabolic panel - T4, free - TSH  4. Family history of brain aneurysm - Ambulatory referral to Cardiology  5. Family history of hypertension in mother - Ambulatory referral to Cardiology   We will call you with lab results and cardiology referral Follow-up in 4 weeks     Follow-up: 4 weeks  An After Visit Summary was printed and given to the patient.  I, Rip Harbour, NP, have reviewed all documentation for this visit. The documentation on 02/26/22 for the exam, diagnosis, procedures, and orders are all accurate and complete.   Signed, Rip Harbour, NP [place] [phone]

## 2022-02-27 LAB — COMPREHENSIVE METABOLIC PANEL
ALT: 20 IU/L (ref 0–32)
AST: 17 IU/L (ref 0–40)
Albumin/Globulin Ratio: 1.8 (ref 1.2–2.2)
Albumin: 4.6 g/dL (ref 4.0–5.0)
Alkaline Phosphatase: 71 IU/L (ref 44–121)
BUN/Creatinine Ratio: 15 (ref 9–23)
BUN: 11 mg/dL (ref 6–20)
Bilirubin Total: 0.4 mg/dL (ref 0.0–1.2)
CO2: 25 mmol/L (ref 20–29)
Calcium: 9.8 mg/dL (ref 8.7–10.2)
Chloride: 101 mmol/L (ref 96–106)
Creatinine, Ser: 0.75 mg/dL (ref 0.57–1.00)
Globulin, Total: 2.6 g/dL (ref 1.5–4.5)
Glucose: 64 mg/dL — ABNORMAL LOW (ref 70–99)
Potassium: 4.3 mmol/L (ref 3.5–5.2)
Sodium: 140 mmol/L (ref 134–144)
Total Protein: 7.2 g/dL (ref 6.0–8.5)
eGFR: 115 mL/min/{1.73_m2} (ref >59)

## 2022-02-27 LAB — CBC WITH DIFFERENTIAL/PLATELET
Basophils Absolute: 0.1 10*3/uL (ref 0.0–0.2)
Basos: 1 %
EOS (ABSOLUTE): 0.1 10*3/uL (ref 0.0–0.4)
Eos: 1 %
Hematocrit: 45.4 % (ref 34.0–46.6)
Hemoglobin: 15.4 g/dL (ref 11.1–15.9)
Immature Grans (Abs): 0.1 10*3/uL (ref 0.0–0.1)
Immature Granulocytes: 1 %
Lymphocytes Absolute: 3.1 10*3/uL (ref 0.7–3.1)
Lymphs: 25 %
MCH: 29.4 pg (ref 26.6–33.0)
MCHC: 33.9 g/dL (ref 31.5–35.7)
MCV: 87 fL (ref 79–97)
Monocytes Absolute: 0.7 10*3/uL (ref 0.1–0.9)
Monocytes: 6 %
Neutrophils Absolute: 8.3 10*3/uL — ABNORMAL HIGH (ref 1.4–7.0)
Neutrophils: 66 %
Platelets: 343 10*3/uL (ref 150–450)
RBC: 5.24 x10E6/uL (ref 3.77–5.28)
RDW: 12.6 % (ref 11.7–15.4)
WBC: 12.3 10*3/uL — ABNORMAL HIGH (ref 3.4–10.8)

## 2022-02-27 LAB — TSH: TSH: 0.782 u[IU]/mL (ref 0.450–4.500)

## 2022-02-27 LAB — T4, FREE: Free T4: 1.32 ng/dL (ref 0.82–1.77)

## 2022-03-05 DIAGNOSIS — M779 Enthesopathy, unspecified: Secondary | ICD-10-CM | POA: Insufficient documentation

## 2022-03-05 DIAGNOSIS — M222X9 Patellofemoral disorders, unspecified knee: Secondary | ICD-10-CM | POA: Insufficient documentation

## 2022-03-05 NOTE — Progress Notes (Unsigned)
Cardiology Office Note:    Date:  03/06/2022   ID:  Bailey Serrano, DOB 01-12-2001, MRN [id number]  PCP:  Janie Morning, NP  Cardiologist:  Norman Herrlich, MD   Referring MD: Janie Morning, NP  ASSESSMENT:    1. Palpitations   2. Sinus tachycardia    PLAN:    In order of problems listed above:  She is having intermittent rapid heart rhythm suggestive of SVT she will wear an event monitor for 2 weeks she thinks we have a high probability of catching an episode and if not she will either purchase the mobile cardia or activate the heart rhythm monitoring and EKG capture from her smart watch. The other issue is resting tachycardia generally not the problem but her response she has normal thyroid CBC and her heart rates are not in the range that raises concern for specific atrial arrhythmia. 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  Next appointment 6 months   Medication Adjustments/Labs and Tests Ordered: Current medicines are reviewed at length with the patient today.  Concerns regarding medicines are outlined above.  Orders Placed This Encounter  Procedures   LONG TERM MONITOR (3-14 DAYS)   EKG 12-Lead   No orders of the defined types were placed in this encounter.    Chief complaint my heart is rapid  History of Present Illness:    Bailey Serrano is a 21 y.o. female with a history of  obesity who is being seen today for the evaluation of tachycardia at the request of Janie Morning, NP.  She is here with to enter related problems. Heart rate at rest was greater than 100 bpm. He is having intermittent episodes several times a month where she feels her heart racing.  Although she has a smart watch she does not use it has not set it up for EKG monitoring has not captured any arrhythmia. The episodes happen at rest usually are short 5 to 10 minutes at the most and resolve spontaneously. She  did have one at work where she captured a heart rate of 150 bpm. Has no known history of disease congenital rheumatic or atrial fibrillation She takes no over-the-counter proarrhythmic drugs or medications currently She has no family history of heart disease arrhythmia. She had COVID infection in 2020 but the symptoms did not start soon afterwards. She does not engage in exercise on a regular basis because of chronic knee pain Past Medical History:  Diagnosis Date   Patellofemoral syndrome    Tendonitis     Past Surgical History:  Procedure Laterality Date   GALLBLADDER SURGERY      Current Medications: No outpatient medications have been marked as taking for the 03/06/22 encounter (Office Visit) with Baldo Daub, MD.     Allergies:   Patient has no known allergies.   Social History   Socioeconomic History   Marital status: Single    Spouse name: Not on file   Number of children: Not on file   Years of education: Not on file   Highest education level: Not on file  Occupational History   Not on file  Tobacco Use   Smoking status: Never   Smokeless tobacco: Never  Vaping Use   Vaping Use: Every day   Substances: Nicotine, Flavoring  Substance and Sexual Activity   Alcohol use: Never   Drug use: Never   Sexual activity: Yes  Birth control/protection: Implant  Other Topics Concern   Not on file  Social History Narrative   Not on file   Social Determinants of Health   Financial Resource Strain: Low Risk  (02/26/2022)   Overall Financial Resource Strain (CARDIA)    Difficulty of Paying Living Expenses: Not hard at all  Food Insecurity: No Food Insecurity (02/26/2022)   Hunger Vital Sign    Worried About Running Out of Food in the Last Year: Never true    Ran Out of Food in the Last Year: Never true  Transportation Needs: No Transportation Needs (02/26/2022)   PRAPARE - Hydrologist (Medical): No    Lack of Transportation  (Non-Medical): No  Physical Activity: Inactive (02/26/2022)   Exercise Vital Sign    Days of Exercise per Week: 0 days    Minutes of Exercise per Session: 0 min  Stress: No Stress Concern Present (02/26/2022)   [place]    Feeling of Stress : Not at all  Social Connections: Moderately Isolated (02/26/2022)   Social Connection and Isolation Panel [NHANES]    Frequency of Communication with Friends and Family: More than three times a week    Frequency of Social Gatherings with Friends and Family: More than three times a week    Attends Religious Services: Never    Marine scientist or Organizations: No    Attends Music therapist: Never    Marital Status: Living with partner     Family History: The patient's family history includes Aneurysm in an other family member; COPD in her maternal grandmother; Diabetes in an other family member; Hypertension in her mother; Muscular dystrophy in her brother. She was adopted.  ROS:   ROS Please see the history of present illness.     All other systems reviewed and are negative.  EKGs/Labs/Other Studies Reviewed:    The following studies were reviewed today:   EKG:  EKG is  ordered today.  The ekg ordered today is personally reviewed and demonstrates sinus rhythm 88 bpm it is a normal EKG  Recent Labs: 02/26/2022: ALT 20; BUN 11; Creatinine, Ser 0.75; Hemoglobin 15.4; Platelets 343; Potassium 4.3; Sodium 140; TSH 0.782    Physical Exam:    VS:  BP 118/86 (BP Location: Left Arm, Patient Position: Sitting, Cuff Size: Normal)   Pulse 91   Ht 5' 5.5" (1.664 m)   Wt 202 lb (91.6 kg)   SpO2 99%   BMI 33.10 kg/m     Wt Readings from Last 3 Encounters:  03/06/22 202 lb (91.6 kg)  02/26/22 201 lb (91.2 kg)  12/14/21 191 lb (86.6 kg)     GEN: Healthy appearin well nourished, well developed in no acute distress HEENT: Normal NECK: No JVD; No carotid  bruits LYMPHATICS: No lymphadenopathy CARDIAC: RRR, no murmurs, rubs, gallops RESPIRATORY:  Clear to auscultation without rales, wheezing or rhonchi  ABDOMEN: Soft, non-tender, non-distended MUSCULOSKELETAL:  No edema; No deformity  SKIN: Warm and dry NEUROLOGIC:  Alert and oriented x 3 PSYCHIATRIC:  Normal affect     Signed, Shirlee More, MD  03/06/2022 5:05 PM    [facility]

## 2022-03-06 ENCOUNTER — Other Ambulatory Visit: Payer: Self-pay

## 2022-03-06 ENCOUNTER — Encounter: Payer: Self-pay | Admitting: Cardiology

## 2022-03-06 ENCOUNTER — Ambulatory Visit: Payer: BC Managed Care – PPO | Attending: Cardiology

## 2022-03-06 ENCOUNTER — Ambulatory Visit: Payer: BC Managed Care – PPO | Attending: Cardiology | Admitting: Cardiology

## 2022-03-06 VITALS — BP 118/86 | HR 91 | Ht 65.5 in | Wt 202.0 lb

## 2022-03-06 DIAGNOSIS — R000 Tachycardia, unspecified: Secondary | ICD-10-CM | POA: Diagnosis not present

## 2022-03-06 DIAGNOSIS — R002 Palpitations: Secondary | ICD-10-CM

## 2022-03-06 DIAGNOSIS — D72828 Other elevated white blood cell count: Secondary | ICD-10-CM

## 2022-03-06 NOTE — Patient Instructions (Addendum)
1. Avoid all over-the-counter antihistamines except Claritin/Loratadine and Zyrtec/Cetrizine. 2. Avoid all combination including cold sinus allergies flu decongestant and sleep medications 3. You can use Robitussin DM Mucinex and Mucinex DM for cough. 4. can use Tylenol aspirin ibuprofen and naproxen but no combinations such as sleep or sinus.   Medication Instructions:  Your physician recommends that you continue on your current medications as directed. Please refer to the Current Medication list given to you today.  *If you need a refill on your cardiac medications before your next appointment, please call your pharmacy*   Lab Work: None Ordered If you have labs (blood work) drawn today and your tests are completely normal, you will receive your results only by: [place] (if you have MyChart) OR A paper copy in the mail If you have any lab test that is abnormal or we need to change your treatment, we will call you to review the results.   Testing/Procedures:  WHY IS MY DOCTOR PRESCRIBING ZIO? The Zio system is proven and trusted by physicians to detect and diagnose irregular heart rhythms -- and has been prescribed to hundreds of thousands of patients.  The FDA has cleared the Zio system to monitor for many different kinds of irregular heart rhythms. In a study, physicians were able to reach a diagnosis 90% of the time with the Zio system1.  You can wear the Zio monitor -- a small, discreet, comfortable patch -- during your normal day-to-day activity, including while you sleep, shower, and exercise, while it records every single heartbeat for analysis.  1Barrett, P., et al. Comparison of 24 Hour Holter Monitoring Versus 14 Day Novel Adhesive Patch Electrocardiographic Monitoring. [place], 2014.  ZIO VS. HOLTER MONITORING The Zio monitor can be comfortably worn for up to 14 days. Holter monitors can be worn for 24 to 48 hours, limiting the time to record  any irregular heart rhythms you may have. Zio is able to capture data for the 51% of patients who have their first symptom-triggered arrhythmia after 48 hours.1  LIVE WITHOUT RESTRICTIONS The Zio ambulatory cardiac monitor is a small, unobtrusive, and water-resistant patch--you might even forget you're wearing it. The Zio monitor records and stores every beat of your heart, whether you're sleeping, working out, or showering.     Follow-Up: At [facility], you and your health needs are our priority.  As part of our continuing mission to provide you with exceptional heart care, we have created designated Provider Care Teams.  These Care Teams include your primary Cardiologist (physician) and Advanced Practice Providers (APPs -  Physician Assistants and Nurse Practitioners) who all work together to provide you with the care you need, when you need it.  We recommend signing up for the patient portal called "MyChart".  Sign up information is provided on this After Visit Summary.  MyChart is used to connect with patients for Virtual Visits (Telemedicine).  Patients are able to view lab/test results, encounter notes, upcoming appointments, etc.  Non-urgent messages can be sent to your provider as well.   To learn more about what you can do with MyChart, go to [email or web address].    Your next appointment:   6 month(s)  The format for your next appointment:   In Person  Provider:   Shirlee More, MD    Other Instructions NA

## 2022-03-13 ENCOUNTER — Other Ambulatory Visit: Payer: Self-pay | Admitting: Oncology

## 2022-03-13 DIAGNOSIS — D72829 Elevated white blood cell count, unspecified: Secondary | ICD-10-CM

## 2022-03-13 NOTE — Progress Notes (Signed)
[facility]  [address] [place],  [place]  [address] [phone]  Clinic Day:  03/14/2022  Referring physician: Janie Morning, NP  HISTORY OF PRESENT ILLNESS:  The patient is a 21 y.o. female  who I was asked to consult upon for leukocytosis.  Labs in October 2023 showed an elevated white count of 12.3, with a normal white count differential.  Of note, she had an elevated white count of 11.3 one year ago.  According to the patient, her recent labs were done as part of a routine follow-up.  She denies having any recent illnesses or inflammatory processes that could have triggered her leukocytosis.  She denies using steroids or other medications that can precipitate leukocytosis.  She also denies having undergone a splenectomy.  The patient admits to vaping frequently throughout the day.  PAST MEDICAL HISTORY:   Past Medical History:  Diagnosis Date   Patellofemoral syndrome    Tendonitis     PAST SURGICAL HISTORY:   Past Surgical History:  Procedure Laterality Date   GALLBLADDER SURGERY      CURRENT MEDICATIONS:   Current Outpatient Medications  Medication Sig Dispense Refill   escitalopram (LEXAPRO) 10 MG tablet Take 1 tablet (10 mg total) by mouth daily. 30 tablet 0   No current facility-administered medications for this visit.    ALLERGIES:  No Known Allergies  FAMILY HISTORY:   Family History  Adopted: Yes  Problem Relation Age of Onset   Hypertension Mother    Alcohol abuse Mother    Drug abuse Mother    COPD Maternal Grandmother     SOCIAL HISTORY:  The patient was born and raised in [place].  She lives in town with her boyfriend.  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  She does not use tobacco or alcohol, but vapes daily.  REVIEW OF SYSTEMS:  Review of Systems  Constitutional:  Positive for fatigue. Negative for fever.  HENT:   Positive for tinnitus. Negative for hearing loss and sore throat.    Eyes:  Negative for eye problems.  Respiratory:  Negative for chest tightness, cough and hemoptysis.   Cardiovascular:  Positive for chest pain and palpitations.  Gastrointestinal:  Positive for nausea. Negative for abdominal distention, abdominal pain, blood in stool, constipation, diarrhea and vomiting.  Endocrine: Negative for hot flashes.  Genitourinary:  Negative for difficulty urinating, dysuria, frequency, hematuria and nocturia.   Musculoskeletal:  Positive for arthralgias and back pain. Negative for gait problem and myalgias.  Skin: Negative.  Negative for itching and rash.  Neurological: Negative.  Negative for dizziness, extremity weakness, gait problem, headaches, light-headedness and numbness.  Hematological: Negative.   Psychiatric/Behavioral:  Positive for depression. Negative for suicidal ideas. The patient is nervous/anxious.     PHYSICAL EXAM:  Blood pressure 118/77, pulse 90, temperature 98.7 F (37.1 C), resp. rate 16, height 5' 5.5" (1.664 m), weight 203 lb 3.2 oz (92.2 kg), SpO2 97 %. Wt Readings from Last 3 Encounters:  03/15/22 203 lb (92.1 kg)  03/14/22 203 lb 3.2 oz (92.2 kg)  03/06/22 202 lb (91.6 kg)   Body mass index is 33.3 kg/m. Performance status (ECOG): 0 - Asymptomatic Physical Exam Constitutional:      Appearance: Normal appearance. She is not ill-appearing.  HENT:     Mouth/Throat:     Mouth: Mucous membranes are moist.     Pharynx: Oropharynx is clear. No oropharyngeal exudate or posterior oropharyngeal erythema.  Cardiovascular:  Rate and Rhythm: Normal rate and regular rhythm.     Heart sounds: No murmur heard.    No friction rub. No gallop.  Pulmonary:     Effort: Pulmonary effort is normal. No respiratory distress.     Breath sounds: Normal breath sounds. No wheezing, rhonchi or rales.  Abdominal:     General: Bowel sounds are normal. There is no distension.     Palpations: Abdomen is soft. There is no mass.     Tenderness: There  is no abdominal tenderness.  Musculoskeletal:        General: No swelling.     Right lower leg: No edema.     Left lower leg: No edema.  Lymphadenopathy:     Cervical: No cervical adenopathy.     Upper Body:     Right upper body: No supraclavicular or axillary adenopathy.     Left upper body: No supraclavicular or axillary adenopathy.     Lower Body: No right inguinal adenopathy. No left inguinal adenopathy.  Skin:    General: Skin is warm.     Coloration: Skin is not jaundiced.     Findings: No lesion or rash.  Neurological:     General: No focal deficit present.     Mental Status: She is alert and oriented to person, place, and time. Mental status is at baseline.  Psychiatric:        Mood and Affect: Mood normal.        Behavior: Behavior normal.        Thought Content: Thought content normal.    LABS:      Latest Ref Rng & Units 03/14/2022   12:00 AM 02/26/2022    3:57 PM 02/07/2021    2:37 PM  CBC  WBC  11.1     12.3  11.3   Hemoglobin 12.0 - 16.0 14.5     15.4  15.5   Hematocrit 36 - 46 43     45.4  46.7   Platelets 150 - 400 K/uL 325     343  337      This result is from an external source.      Latest Ref Rng & Units 03/14/2022    2:57 PM 02/26/2022    3:57 PM 02/07/2021    2:37 PM  CMP  Glucose 70 - 99 mg/dL 97  64  79   BUN 6 - 20 mg/dL 10  11  8    Creatinine 0.44 - 1.00 mg/dL  3.78  5.88   Sodium 135 - 145 mmol/L 137  140  142   Potassium 3.5 - 5.1 mmol/L 3.5  4.3  4.4   Chloride 98 - 111 mmol/L 106  101  104   CO2 22 - 32 mmol/L 24  25  19    Calcium 8.9 - 10.3 mg/dL 9.0  9.8  9.9   Total Protein 6.5 - 8.1 g/dL 7.4  7.2  7.4   Total Bilirubin 0.3 - 1.2 mg/dL 0.8  0.4  5.02   Alkaline Phos 38 - 126 U/L 53  71  77   AST 15 - 41 U/L 24  17  19    ALT 0 - 44 U/L 27  20  20     ASSESSMENT & PLAN:  A 21 y.o. female who I was asked to consult upon for leukocytosis.  Her white count is only minimally elevated today.  There is nothing per her recent history  to suggest an infection, inflammatory  process or hematologic disorder is present.   When evaluating her clinical picture, it appears the patient's vaping could be behind her mild leukocytosis.  I encouraged her to abstain from further vaping, as it could lead to pulmonary toxicity over time.  Overall, I am not concerned about an ominous process being present.  I do feel comfortable turning her care back over to her primary care office with the recommendation that her CBC be checked, at most, 1-2 times per year.   I would not have a problem seeing her in the future if new hematologic issues develop that require repeat clinical assessment.  The patient understands all the plans discussed today and is in agreement with them.  I do appreciate Janie Morning, NP for his new consult.   Leeanna Slaby Kirby Funk, MD

## 2022-03-14 ENCOUNTER — Inpatient Hospital Stay: Payer: BC Managed Care – PPO | Attending: Oncology | Admitting: Oncology

## 2022-03-14 ENCOUNTER — Other Ambulatory Visit: Payer: Self-pay

## 2022-03-14 ENCOUNTER — Encounter: Payer: Self-pay | Admitting: Oncology

## 2022-03-14 ENCOUNTER — Inpatient Hospital Stay: Payer: BC Managed Care – PPO

## 2022-03-14 VITALS — BP 118/77 | HR 90 | Temp 98.7°F | Resp 16 | Ht 65.5 in | Wt 203.2 lb

## 2022-03-14 DIAGNOSIS — D72829 Elevated white blood cell count, unspecified: Secondary | ICD-10-CM

## 2022-03-14 DIAGNOSIS — F17290 Nicotine dependence, other tobacco product, uncomplicated: Secondary | ICD-10-CM | POA: Insufficient documentation

## 2022-03-14 LAB — CMP (CANCER CENTER ONLY)
ALT: 27 U/L (ref 0–44)
AST: 24 U/L (ref 15–41)
Albumin: 4.1 g/dL (ref 3.5–5.0)
Alkaline Phosphatase: 53 U/L (ref 38–126)
Anion gap: 7 (ref 5–15)
BUN: 10 mg/dL (ref 6–20)
CO2: 24 mmol/L (ref 22–32)
Calcium: 9 mg/dL (ref 8.9–10.3)
Chloride: 106 mmol/L (ref 98–111)
Creatinine: 0.75 mg/dL (ref 0.44–1.00)
GFR, Estimated: >60 mL/min (ref >60)
Glucose, Bld: 97 mg/dL (ref 70–99)
Potassium: 3.5 mmol/L (ref 3.5–5.1)
Sodium: 137 mmol/L (ref 135–145)
Total Bilirubin: 0.8 mg/dL (ref 0.3–1.2)
Total Protein: 7.4 g/dL (ref 6.5–8.1)

## 2022-03-14 LAB — CBC AND DIFFERENTIAL
HCT: 43 (ref 36–46)
Hemoglobin: 14.5 (ref 12.0–16.0)
Neutrophils Absolute: 7.22
Platelets: 325 10*3/uL (ref 150–400)
WBC: 11.1

## 2022-03-14 LAB — CBC: RBC: 5.01 (ref 3.87–5.11)

## 2022-03-15 ENCOUNTER — Ambulatory Visit: Payer: BC Managed Care – PPO | Admitting: Nurse Practitioner

## 2022-03-15 ENCOUNTER — Encounter: Payer: Self-pay | Admitting: Nurse Practitioner

## 2022-03-15 VITALS — BP 122/82 | HR 93 | Temp 97.1°F | Ht 65.5 in | Wt 203.0 lb

## 2022-03-15 DIAGNOSIS — F418 Other specified anxiety disorders: Secondary | ICD-10-CM

## 2022-03-15 DIAGNOSIS — F411 Generalized anxiety disorder: Secondary | ICD-10-CM

## 2022-03-15 MED ORDER — ESCITALOPRAM OXALATE 10 MG PO TABS: 10.0000 mg | ORAL_TABLET | Freq: Every day | ORAL | 0 refills | Status: DC

## 2022-03-15 NOTE — Patient Instructions (Addendum)
Follow-up with cardiologist as scheduled Continue heart monitor Begin Lexapro 10 mg daily  Stop medication immediately for any adverse side effects Recommend counseling Follow-up in 4 weeks    Sinus Tachycardia  Sinus tachycardia is a fast heartbeat. In sinus tachycardia, the heart beats more than 100 times a minute. Sinus tachycardia starts in the part of the heart called the sinoatrial (SA) node. Sinus tachycardia may be harmless, or it may be a sign of a serious condition. What are the causes? This condition may be caused by: Exercise or exertion. A fever. Pain. Loss of body fluids (dehydration). Severe bleeding (hemorrhage). Anxiety and stress. Certain substances, including: Alcohol. Caffeine. Tobacco and nicotine products. Cold medicines. Illegal drugs. Medical conditions including: Heart disease. An infection. An overactive thyroid (hyperthyroidism). A lack of red blood cells (anemia). What are the signs or symptoms? Symptoms of this condition include: A feeling that the heart is beating fast or unevenly (palpitations). Suddenly noticing your heartbeat (cardiac awareness). Lightheadedness. Tiredness (fatigue). Shortness of breath. Chest pain. Nausea. Fainting. How is this diagnosed? This condition is diagnosed with: A physical exam. Tests or monitoring, such as: Blood tests. An electrocardiogram (ECG). This test measures the electrical activity of the heart. Ambulatory cardiac monitor. This records your heartbeats for 24 hours or more. You may be referred to a heart specialist (cardiologist). How is this treated? Treatment for this condition depends on the cause. Treatment may involve: Treating the underlying condition. Taking new medicines or changing your current medicines as told by your health care provider. Making changes to your diet or lifestyle. Follow these instructions at home: Lifestyle  Do not use any products that contain nicotine or  tobacco. These products include cigarettes, chewing tobacco, and vaping devices, such as e-cigarettes. If you need help quitting, ask your health care provider. Do not use illegal drugs, such as cocaine. Learn relaxation methods to help you when you get stressed or anxious. These include deep breathing. Avoid caffeine or other stimulants, including herbal stimulants that are found in energy drinks. Alcohol use  Do not drink alcohol if: Your health care provider tells you not to drink. You are pregnant, may be pregnant, or are planning to become pregnant. If you drink alcohol: Limit how much you have to: 0-1 drink a day for women. 0-2 drinks a day for men. Know how much alcohol is in your drink. In the [place] one drink equals one 12 oz bottle of beer (355 mL), one 5 oz glass of wine (148 mL), or one 1 oz glass of hard liquor (44 mL). General instructions Drink enough fluids to keep your urine pale yellow. Take over-the-counter and prescription medicines only as told by your health care provider. Ask your health care provider about taking vitamins, herbs, and supplements. Contact a health care provider if: You have vomiting or diarrhea that does not go away. You have a fever. You have weakness or dizziness. You feel faint. Get help right away if: You have pain in your chest, upper arms, jaw, or neck. You have palpitations that do not go away. Summary In sinus tachycardia, the heart beats more than 100 times a minute. Sinus tachycardia may be harmless, or it may be a sign of a serious condition. Treatment for this condition depends on the cause or the underlying condition. Get help right away if you have pain in your chest, upper arms, jaw, or neck. This information is not intended to replace advice given to you by your health care provider. Make sure  you discuss any questions you have with your health care provider. Document Revised: 08/21/2021 Document Reviewed: 08/21/2021 Elsevier  Patient Education  2023 [employer] Inc[employer]   Leukocytosis Leukocytosis means that a person has more white blood cells than normal. White blood cells are made in the bone marrow. Bone marrow is the spongy tissue inside bones. The main job of white blood cells is to fight infection. Having too many white blood cells is a common condition. It can develop as a result of many types of medical problems. What are the causes? Leukocytosis may be caused by various conditions. In some cases, the bone marrow is normal but is still making too many white blood cells. This could be the result of: Infection. Injury. Physical stress. Emotional stress. Surgery. Allergic reactions. Certain medicines. Other causes may include: A genetic or inherited disease. Chronic inflammatory conditions. Tumors that start in other areas of the body, but not in the blood or bone marrow. Pregnancy and labor. In other cases, a person may have a bone marrow disorder that is causing the body to make too many white blood cells. Bone marrow disorders include: Leukemia. This is a type of blood cancer. Myeloproliferative disorders. These disorders cause blood cells to grow abnormally. What are the signs or symptoms? Often, this condition causes no symptoms. Some people may have symptoms due to the medical condition that is causing their leukocytosis. These symptoms may include: Bleeding. Bruising. Fever. Night sweats. Weakness. Weight loss. Other symptoms may include: An enlarged spleen. Swollen lymph nodes. Repeated infections. How is this diagnosed? This condition is diagnosed with blood tests. It is often found when blood is tested as part of a routine physical exam. You may have other tests to help determine why you have too many white blood cells. These tests may include: A complete blood count (CBC). This test measures all the types of blood cells in your body. Chest X-rays, urine tests, or other tests to look for  signs of infection. Bone marrow aspiration. For this test, a needle is put into your bone. Cells from the bone marrow are removed through the needle and examined under a microscope. Other tests on the blood or bone marrow sample. CT scan, bone scan, or other imaging tests. How is this treated? Usually, treatment is not needed for leukocytosis. However, if an infection, cancer, bone marrow disorder, or other serious problem is causing your leukocytosis, it will need to be treated. Treatment may include: Regular monitoring of your white blood cell count to look for changes. Antibiotic medicine if you have a bacterial infection. Bone marrow transplant. This treatment replaces your diseased bone marrow with healthy cells that will grow new bone marrow. Chemotherapy or biological therapies such as the use of antibodies. These treatments may be used to kill cancer cells or to decrease the number of white blood cells. Follow these instructions at home: Medicines Take over-the-counter and prescription medicines only as told by your health care provider. If you were prescribed an antibiotic medicine, take it as told by your health care provider. Do not stop taking the antibiotic even if you start to feel better. Eating and drinking  Eat foods that are low in saturated fats and high in fiber. Eat plenty of fruits and vegetables. Drink enough fluid to keep your urine pale yellow. Limit your intake of caffeine and alcohol. General instructions Maintain a healthy weight. Ask your health care provider what weight is best for you. Do 30 minutes of exercise at least 5 times  each week. Check with your health care provider before you start a new exercise routine. Follow any safety precautions as told by your health care provider. This may be needed if your condition causes an increased risk for infection or bleeding. Do not use any products that contain nicotine or tobacco. These products include cigarettes,  chewing tobacco, and vaping devices, such as e-cigarettes. If you need help quitting, ask your health care provider. Keep all follow-up visits. This is important. Contact a health care provider if: You feel weak or more tired than usual. You develop chills, a cough, or nasal congestion. You have a fever. You lose weight without trying. You have night sweats. You bruise easily. You have new or worsening symptoms. Get help right away if: You bleed more than normal or your bleeding is difficult to stop. You have chest pain or trouble breathing. You have nausea or vomiting that does not stop. You feel dizzy or light-headed, or you lose consciousness. These symptoms may be an emergency. Get help right away. Call 911. Do not wait to see if the symptoms will go away. Do not drive yourself to the hospital. Summary Leukocytosis means that a person has more white blood cells than normal. This condition often causes no symptoms. This condition may be caused by various conditions. Keep all follow-up visits. This is important. This information is not intended to replace advice given to you by your health care provider. Make sure you discuss any questions you have with your health care provider. Document Revised: 11/26/2020 Document Reviewed: 11/26/2020 Elsevier Patient Education  2023 [employer].

## 2022-03-15 NOTE — Progress Notes (Unsigned)
Subjective:  Patient ID: Bailey Serrano, female    DOB: 08-Dec-2000  Age: 21 y.o. MRN: [id number]  Chief Complaint  Patient presents with   2 Week Follow-up    HPI   Patient presents today for a 2 week follow up.  Anxiety,  Current treatment includes none  She feels her anxiety is {Desc; severity:60313} and {improved/worse/unchanged:3041574} since last visit.   GAD-7 Results    02/26/2022    3:38 PM  GAD-7 Generalized Anxiety Disorder Screening Tool  1. Feeling Nervous, Anxious, or on Edge 1  2. Not Being Able to Stop or Control Worrying 1  3. Worrying Too Much About Different Things 2  4. Trouble Relaxing 1  5. Being So Restless it's Hard To Sit Still 1  6. Becoming Easily Annoyed or Irritable 2  7. Feeling Afraid As If Something Awful Might Happen 0  Total GAD-7 Score 8  Difficulty At Work, Home, or Getting  Along With Others? Somewhat difficult    PHQ-9 Scores    02/26/2022    3:42 PM 03/07/2021   10:10 AM 02/07/2021    1:45 PM  PHQ9 SCORE ONLY  PHQ-9 Total Score 7 4 0   Depression, Follow-up   Current treatment includes none.   Current symptoms include: {Symptoms; depression:1002} She feels she is {improved/worse/unchanged:3041574} since last visit.     02/26/2022    3:42 PM 03/07/2021   10:10 AM 02/07/2021    1:45 PM  Depression screen PHQ 2/9  Decreased Interest 1 0 0  Down, Depressed, Hopeless 0 0 0  PHQ - 2 Score 1 0 0  Altered sleeping 2 1   Tired, decreased energy 3 3   Change in appetite 0 0   Feeling bad or failure about yourself  0 0   Trouble concentrating 1 0   Moving slowly or fidgety/restless 0 0   Suicidal thoughts 0 0   PHQ-9 Score 7 4   Difficult doing work/chores Somewhat difficult Not difficult at all       No current outpatient medications on file prior to visit.   No current facility-administered medications on file prior to visit.   Past Medical History:  Diagnosis Date   Patellofemoral syndrome    Tendonitis     Past Surgical History:  Procedure Laterality Date   GALLBLADDER SURGERY      Family History  Adopted: Yes  Problem Relation Age of Onset   Hypertension Mother    Alcohol abuse Mother    Drug abuse Mother    COPD Maternal Grandmother    Social History   Socioeconomic History   Marital status: Single    Spouse name: Not on file   Number of children: 0   Years of education: 12 + 4   Highest education level: Not on file  Occupational History   Occupation: [employer]  Tobacco Use   Smoking status: Never   Smokeless tobacco: Never  Vaping Use   Vaping Use: Every day   Substances: Nicotine, Flavoring  Substance and Sexual Activity   Alcohol use: Never   Drug use: Never   Sexual activity: Yes    Birth control/protection: None  Other Topics Concern   Not on file  Social History Narrative   Not on file   Social Determinants of Health   Financial Resource Strain: Low Risk  (02/26/2022)   Overall Financial Resource Strain (CARDIA)    Difficulty of Paying Living Expenses: Not hard at all  Food  Insecurity: No Food Insecurity (02/26/2022)   Hunger Vital Sign    Worried About Running Out of Food in the Last Year: Never true    Ran Out of Food in the Last Year: Never true  Transportation Needs: No Transportation Needs (02/26/2022)   PRAPARE - Administrator, Civil Service (Medical): No    Lack of Transportation (Non-Medical): No  Physical Activity: Inactive (02/26/2022)   Exercise Vital Sign    Days of Exercise per Week: 0 days    Minutes of Exercise per Session: 0 min  Stress: No Stress Concern Present (02/26/2022)   [employer] of Occupational Health - Occupational Stress Questionnaire    Feeling of Stress : Not at all  Social Connections: Moderately Isolated (02/26/2022)   Social Connection and Isolation Panel [NHANES]    Frequency of Communication with Friends and Family: More than three times a week    Frequency of Social  Gatherings with Friends and Family: More than three times a week    Attends Religious Services: Never    Database administrator or Organizations: No    Attends Banker Meetings: Never    Marital Status: Living with partner    Review of Systems  Constitutional:  Negative for chills, fatigue and fever.  HENT:  Negative for congestion, ear pain, rhinorrhea and sore throat.   Respiratory:  Negative for cough and shortness of breath.   Cardiovascular:  Negative for chest pain.  Gastrointestinal:  Negative for abdominal pain, constipation, diarrhea, nausea and vomiting.  Genitourinary:  Negative for dysuria and urgency.  Musculoskeletal:  Negative for back pain and myalgias.  Neurological:  Negative for dizziness, weakness, light-headedness and headaches.  Psychiatric/Behavioral:  Negative for dysphoric mood. The patient is not nervous/anxious.      Objective:  BP 122/82   Pulse 93   Temp (!) 97.1 F (36.2 C)   Ht 5' 5.5" (1.664 m)   Wt 203 lb (92.1 kg)   SpO2 98%   BMI 33.27 kg/m      03/15/2022    9:13 AM 03/14/2022    3:38 PM 03/06/2022    3:53 PM  BP/Weight  Systolic BP 122 118 118  Diastolic BP 82 77 86  Wt. (Lbs) 203 203.2   BMI 33.27 kg/m2 33.3 kg/m2     Physical Exam  Diabetic Foot Exam - Simple   No data filed      Lab Results  Component Value Date   WBC 11.1 03/14/2022   HGB 14.5 03/14/2022   HCT 43 03/14/2022   PLT 325 03/14/2022   GLUCOSE 97 03/14/2022   ALT 27 03/14/2022   AST 24 03/14/2022   NA 137 03/14/2022   K 3.5 03/14/2022   CL 106 03/14/2022   CREATININE 0.75 03/14/2022   BUN 10 03/14/2022   CO2 24 03/14/2022   TSH 0.782 02/26/2022      Assessment & Plan:       Follow-up with cardiologist as scheduled Continue heart monitor Begin Lexapro 10 mg daily  Stop medication immediately for any adverse side effects Recommend counseling Follow-up in 4 weeks  Follow-up: 4-weeks, GAD med follow-up  An After Visit Summary  was printed and given to the patient.  Janie Morning, NP [facility] [phone]

## 2022-03-20 DIAGNOSIS — D72829 Elevated white blood cell count, unspecified: Secondary | ICD-10-CM | POA: Insufficient documentation

## 2022-04-10 NOTE — Progress Notes (Unsigned)
Subjective:  Patient ID: Bailey Serrano, female    DOB: 03/11/2001  Age: 21 y.o. MRN: [id number]  No chief complaint on file.   HPI   Follow-up with cardiologist as scheduled Continue heart monitor Begin Lexapro 10 mg daily  Stop medication immediately for any adverse side effects Recommend counseling   Anxiety, Follow-up  She was last seen for anxiety 4 weeks ago. Current treatment includes Lexapro 10 mg daily.   She reports {excellent/good/fair/poor:19665} compliance with treatment. She reports {good/fair/poor:18685} tolerance of treatment. She {is/is not:21021397} having side effects. {document side effects if present:1}  She feels her anxiety is {Desc; severity:60313} and {improved/worse/unchanged:3041574} since last visit.   GAD-7 Results    02/26/2022    3:38 PM  GAD-7 Generalized Anxiety Disorder Screening Tool  1. Feeling Nervous, Anxious, or on Edge 1  2. Not Being Able to Stop or Control Worrying 1  3. Worrying Too Much About Different Things 2  4. Trouble Relaxing 1  5. Being So Restless it's Hard To Sit Still 1  6. Becoming Easily Annoyed or Irritable 2  7. Feeling Afraid As If Something Awful Might Happen 0  Total GAD-7 Score 8  Difficulty At Work, Home, or Getting  Along With Others? Somewhat difficult    PHQ-9 Scores    02/26/2022    3:42 PM 03/07/2021   10:10 AM 02/07/2021    1:45 PM  PHQ9 SCORE ONLY  PHQ-9 Total Score 7 4 0    Current Outpatient Medications on File Prior to Visit  Medication Sig Dispense Refill   escitalopram (LEXAPRO) 10 MG tablet Take 1 tablet (10 mg total) by mouth daily. 30 tablet 0   No current facility-administered medications on file prior to visit.   Past Medical History:  Diagnosis Date   Patellofemoral syndrome    Tendonitis    Past Surgical History:  Procedure Laterality Date   GALLBLADDER SURGERY      Family History  Adopted: Yes  Problem Relation Age of Onset   Hypertension Mother    Alcohol abuse  Mother    Drug abuse Mother    COPD Maternal Grandmother    Social History   Socioeconomic History   Marital status: Single    Spouse name: Not on file   Number of children: 0   Years of education: 12 + 4   Highest education level: Not on file  Occupational History   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  Tobacco Use   Smoking status: Never   Smokeless tobacco: Never  Vaping Use   Vaping Use: Every day   Substances: Nicotine, Flavoring  Substance and Sexual Activity   Alcohol use: Never   Drug use: Never   Sexual activity: Yes    Birth control/protection: None  Other Topics Concern   Not on file  Social History Narrative   Not on file   Social Determinants of Health   Financial Resource Strain: Low Risk  (02/26/2022)   Overall Financial Resource Strain (CARDIA)    Difficulty of Paying Living Expenses: Not hard at all  Food Insecurity: No Food Insecurity (02/26/2022)   Hunger Vital Sign    Worried About Running Out of Food in the Last Year: Never true    Ran Out of Food in the Last Year: Never true  Transportation Needs: No Transportation Needs (02/26/2022)   PRAPARE - Administrator, Civil Service (Medical): No    Lack of Transportation (Non-Medical): No  Physical Activity: Inactive (02/26/2022)  Exercise Vital Sign    Days of Exercise per Week: 0 days    Minutes of Exercise per Session: 0 min  Stress: No Stress Concern Present (02/26/2022)   [employer] of Occupational Health - Occupational Stress Questionnaire    Feeling of Stress : Not at all  Social Connections: Moderately Isolated (02/26/2022)   Social Connection and Isolation Panel [NHANES]    Frequency of Communication with Friends and Family: More than three times a week    Frequency of Social Gatherings with Friends and Family: More than three times a week    Attends Religious Services: Never    Database administrator or Organizations: No    Attends Banker  Meetings: Never    Marital Status: Living with partner    Review of Systems  Constitutional:  Negative for chills, fatigue and fever.  HENT:  Negative for congestion, ear pain, rhinorrhea and sore throat.   Respiratory:  Negative for cough and shortness of breath.   Cardiovascular:  Negative for chest pain.  Gastrointestinal:  Negative for abdominal pain, constipation, diarrhea, nausea and vomiting.  Genitourinary:  Negative for dysuria and urgency.  Musculoskeletal:  Negative for back pain and myalgias.  Neurological:  Negative for dizziness, weakness, light-headedness and headaches.  Psychiatric/Behavioral:  Negative for dysphoric mood. The patient is not nervous/anxious.      Objective:  There were no vitals taken for this visit.     03/15/2022    9:13 AM 03/14/2022    3:38 PM 03/06/2022    3:53 PM  BP/Weight  Systolic BP 122 118 118  Diastolic BP 82 77 86  Wt. (Lbs) 203 203.2   BMI 33.27 kg/m2 33.3 kg/m2     Physical Exam  Diabetic Foot Exam - Simple   No data filed      Lab Results  Component Value Date   WBC 11.1 03/14/2022   HGB 14.5 03/14/2022   HCT 43 03/14/2022   PLT 325 03/14/2022   GLUCOSE 97 03/14/2022   ALT 27 03/14/2022   AST 24 03/14/2022   NA 137 03/14/2022   K 3.5 03/14/2022   CL 106 03/14/2022   CREATININE 0.75 03/14/2022   BUN 10 03/14/2022   CO2 24 03/14/2022   TSH 0.782 02/26/2022      Assessment & Plan:   Problem List Items Addressed This Visit   None .  No orders of the defined types were placed in this encounter.   No orders of the defined types were placed in this encounter.    Follow-up: No follow-ups on file.  An After Visit Summary was printed and given to the patient.  Janie Morning, NP [facility] [phone]

## 2022-04-11 ENCOUNTER — Encounter: Payer: Self-pay | Admitting: Nurse Practitioner

## 2022-04-11 ENCOUNTER — Ambulatory Visit: Payer: BC Managed Care – PPO | Admitting: Nurse Practitioner

## 2022-04-11 VITALS — BP 102/70 | HR 84 | Temp 97.5°F | Ht 65.5 in | Wt 205.0 lb

## 2022-04-11 DIAGNOSIS — F411 Generalized anxiety disorder: Secondary | ICD-10-CM | POA: Diagnosis not present

## 2022-04-11 DIAGNOSIS — F321 Major depressive disorder, single episode, moderate: Secondary | ICD-10-CM

## 2022-04-11 MED ORDER — DESVENLAFAXINE SUCCINATE ER 50 MG PO TB24: 50.0000 mg | ORAL_TABLET | Freq: Every day | ORAL | 0 refills | Status: DC

## 2022-04-11 NOTE — Patient Instructions (Signed)
Stop Lexapro 10 mg daily Begin Pristiq 50 mg daily Stop medications immediately if you have any worsening of symptoms or side effects Recommend counseling Follow-up in 4 weeks Managing Depression, Adult Depression is a mental health condition that affects your thoughts, feelings, and actions. Being diagnosed with depression can bring you relief if you did not know why you have felt or behaved a certain way. It could also leave you feeling overwhelmed. Finding ways to manage your symptoms can help you feel more positive about your future. How to manage lifestyle changes Being depressed is difficult. Depression can increase the level of everyday stress. Stress can make depression symptoms worse. You may believe your symptoms cannot be managed or will never improve. However, there are many things you can try to help manage your symptoms. There is hope. Managing stress  Stress is your body's reaction to life changes and events, both good and bad. Stress can add to your feelings of depression. Learning to manage your stress can help lessen your feelings of depression. Try some of the following approaches to reducing your stress (stress reduction techniques): Listen to music that you enjoy and that inspires you. Try using a meditation app or take a meditation class. Develop a practice that helps you connect with your spiritual self. Walk in nature, pray, or go to a place of worship. Practice deep breathing. To do this, inhale slowly through your nose. Pause at the top of your inhale for a few seconds and then exhale slowly, letting yourself relax. Repeat this three or four times. Practice yoga to help relax and work your muscles. Choose a stress reduction technique that works for you. These techniques take time and practice to develop. Set aside 5-15 minutes a day to do them. Therapists can offer training in these techniques. Do these things to help manage stress: Keep a journal. Know your limits. Set  healthy boundaries for yourself and others, such as saying "no" when you think something is too much. Pay attention to how you react to certain situations. You may not be able to control everything, but you can change your reaction. Add humor to your life by watching funny movies or shows. Make time for activities that you enjoy and that relax you. Spend less time using electronics, especially at night before bed. The light from screens can make your brain think it is time to get up rather than go to bed.  Medicines Medicines, such as antidepressants, are often a part of treatment for depression. Talk with your pharmacist or health care provider about all the medicines, supplements, and herbal products that you take, their possible side effects, and what medicines and other products are safe to take together. Make sure to report any side effects you may have to your health care provider. Relationships Your health care provider may suggest family therapy, couples therapy, or individual therapy as part of your treatment. How to recognize changes Everyone responds differently to treatment for depression. As you recover from depression, you may start to: Have more interest in doing activities. Feel more hopeful. Have more energy. Eat a more regular amount of food. Have better mental focus. It is important to recognize if your depression is not getting better or is getting worse. The symptoms you had in the beginning may return, such as: Feeling tired. Eating too much or too little. Sleeping too much or too little. Feeling restless, agitated, or hopeless. Trouble focusing or making decisions. Having unexplained aches and pains. Feeling irritable, angry,  or aggressive. If you or your family members notice these symptoms coming back, let your health care provider know right away. Follow these instructions at home: Activity Try to get some form of exercise each day, such as walking. Try yoga,  mindfulness, or other stress reduction techniques. Participate in group activities if you are able. Lifestyle Get enough sleep. Cut down on or stop using caffeine, tobacco, alcohol, and any other harmful substances. Eat a healthy diet that includes plenty of vegetables, fruits, whole grains, low-fat dairy products, and lean protein. Limit foods that are high in solid fats, added sugar, or salt (sodium). General instructions Take over-the-counter and prescription medicines only as told by your health care provider. Keep all follow-up visits. It is important for your health care provider to check on your mood, behavior, and medicines. Your health care provider may need to make changes to your treatment. Where to find support Talking to others  Friends and family members can be sources of support and guidance. Talk to trusted friends or family members about your condition. Explain your symptoms and let them know that you are working with a health care provider to treat your depression. Tell friends and family how they can help. Finances Find mental health providers that fit with your financial situation. Talk with your health care provider if you are worried about access to food, housing, or medicine. Call your insurance company to learn about your co-pays and prescription plan. Where to find more information You can find support in your area from: Anxiety and Depression Association of America (ADAA): adaa.org Mental Health America: mentalhealthamerica.net The First American on Mental Illness: nami.org Contact a health care provider if: You stop taking your antidepressant medicines, and you have any of these symptoms: Nausea. Headache. Light-headedness. Chills and body aches. Not being able to sleep (insomnia). You or your friends and family think your depression is getting worse. Get help right away if: You have thoughts of hurting yourself or others. Get help right away if you feel like  you may hurt yourself or others, or have thoughts about taking your own life. Go to your nearest emergency room or: Call 911. Call the National Suicide Prevention Lifeline at 780-357-0644 or 988. This is open 24 hours a day. Text the Crisis Text Line at 214-131-7484. This information is not intended to replace advice given to you by your health care provider. Make sure you discuss any questions you have with your health care provider. Document Revised: 08/28/2021 Document Reviewed: 08/28/2021 Elsevier Patient Education  2023 Elsevier Inc.  Managing Anxiety, Adult After being diagnosed with anxiety, you may be relieved to know why you have felt or behaved a certain way. You may also feel overwhelmed about the treatment ahead and what it will mean for your life. With care and support, you can manage this condition. How to manage lifestyle changes Managing stress and anxiety  Stress is your body's reaction to life changes and events, both good and bad. Most stress will last just a few hours, but stress can be ongoing and can lead to more than just stress. Although stress can play a major role in anxiety, it is not the same as anxiety. Stress is usually caused by something external, such as a deadline, test, or competition. Stress normally passes after the triggering event has ended.  Anxiety is caused by something internal, such as imagining a terrible outcome or worrying that something will go wrong that will devastate you. Anxiety often does not go away even  after the triggering event is over, and it can become long-term (chronic) worry. It is important to understand the differences between stress and anxiety and to manage your stress effectively so that it does not lead to an anxious response. Talk with your health care provider or a counselor to learn more about reducing anxiety and stress. He or she may suggest tension reduction techniques, such as: Music therapy. Spend time creating or listening to  music that you enjoy and that inspires you. Mindfulness-based meditation. Practice being aware of your normal breaths while not trying to control your breathing. It can be done while sitting or walking. Centering prayer. This involves focusing on a word, phrase, or sacred image that means something to you and brings you peace. Deep breathing. To do this, expand your stomach and inhale slowly through your nose. Hold your breath for 3-5 seconds. Then exhale slowly, letting your stomach muscles relax. Self-talk. Learn to notice and identify thought patterns that lead to anxiety reactions and change those patterns to thoughts that feel peaceful. Muscle relaxation. Taking time to tense muscles and then relax them. Choose a tension reduction technique that fits your lifestyle and personality. These techniques take time and practice. Set aside 5-15 minutes a day to do them. Therapists can offer counseling and training in these techniques. The training to help with anxiety may be covered by some insurance plans. Other things you can do to manage stress and anxiety include: Keeping a stress diary. This can help you learn what triggers your reaction and then learn ways to manage your response. Thinking about how you react to certain situations. You may not be able to control everything, but you can control your response. Making time for activities that help you relax and not feeling guilty about spending your time in this way. Doing visual imagery. This involves imagining or creating mental pictures to help you relax. Practicing yoga. Through yoga poses, you can lower tension and promote relaxation.  Medicines Medicines can help ease symptoms. Medicines for anxiety include: Antidepressant medicines. These are usually prescribed for long-term daily control. Anti-anxiety medicines. These may be added in severe cases, especially when panic attacks occur. Medicines will be prescribed by a health care provider.  When used together, medicines, psychotherapy, and tension reduction techniques may be the most effective treatment. Relationships Relationships can play a big part in helping you recover. Try to spend more time connecting with trusted friends and family members. Consider going to couples counseling if you have a partner, taking family education classes, or going to family therapy. Therapy can help you and others better understand your condition. How to recognize changes in your anxiety Everyone responds differently to treatment for anxiety. Recovery from anxiety happens when symptoms decrease and stop interfering with your daily activities at home or work. This may mean that you will start to: Have better concentration and focus. Worry will interfere less in your daily thinking. Sleep better. Be less irritable. Have more energy. Have improved memory. It is also important to recognize when your condition is getting worse. Contact your health care provider if your symptoms interfere with home or work and you feel like your condition is not improving. Follow these instructions at home: Activity Exercise. Adults should do the following: Exercise for at least 150 minutes each week. The exercise should increase your heart rate and make you sweat (moderate-intensity exercise). Strengthening exercises at least twice a week. Get the right amount and quality of sleep. Most adults need 7-9 hours  of sleep each night. Lifestyle  Eat a healthy diet that includes plenty of vegetables, fruits, whole grains, low-fat dairy products, and lean protein. Do not eat a lot of foods that are high in fats, added sugars, or salt (sodium). Make choices that simplify your life. Do not use any products that contain nicotine or tobacco. These products include cigarettes, chewing tobacco, and vaping devices, such as e-cigarettes. If you need help quitting, ask your health care provider. Avoid caffeine, alcohol, and certain  over-the-counter cold medicines. These may make you feel worse. Ask your pharmacist which medicines to avoid. General instructions Take over-the-counter and prescription medicines only as told by your health care provider. Keep all follow-up visits. This is important. Where to find support You can get help and support from these sources: Self-help groups. Online and [employer]community organizations. A trusted spiritual leader. Couples counseling. Family education classes. Family therapy. Where to find more information You may find that joining a support group helps you deal with your anxiety. The following sources can help you locate counselors or support groups near you: Mental Health America: www.mentalhealthamerica.net Anxiety and Depression Association of [place] (ADAA): ProgramCam.dewww.adaa.org The First Americanational Alliance on Mental Illness (NAMI): www.nami.org Contact a health care provider if: You have a hard time staying focused or finishing daily tasks. You spend many hours a day feeling worried about everyday life. You become exhausted by worry. You start to have headaches or frequently feel tense. You develop chronic nausea or diarrhea. Get help right away if: You have a racing heart and shortness of breath. You have thoughts of hurting yourself or others. If you ever feel like you may hurt yourself or others, or have thoughts about taking your own life, get help right away. Go to your nearest emergency department or: Call your local emergency services (911 in the [place] Call a suicide crisis helpline, such as the National Suicide Prevention Lifeline at (205)168-66641-906-281-5443 or 988 in the [place] This is open 24 hours a day in the [place] Text the Crisis Text Line at 785 561 3590741741 (in the [place] Summary Taking steps to learn and use tension reduction techniques can help calm you and help prevent triggering an anxiety reaction. When used together, medicines, psychotherapy, and tension reduction techniques may be the most  effective treatment. Family, friends, and partners can play a big part in supporting you. This information is not intended to replace advice given to you by your health care provider. Make sure you discuss any questions you have with your health care provider. Document Revised: 11/15/2020 Document Reviewed: 08/13/2020 Elsevier Patient Education  2023 [employer]

## 2022-04-12 ENCOUNTER — Ambulatory Visit: Payer: BC Managed Care – PPO | Admitting: Nurse Practitioner

## 2022-05-09 ENCOUNTER — Encounter: Payer: Self-pay | Admitting: Nurse Practitioner

## 2022-05-09 ENCOUNTER — Other Ambulatory Visit: Payer: Self-pay | Admitting: Nurse Practitioner

## 2022-05-09 ENCOUNTER — Ambulatory Visit: Payer: BC Managed Care – PPO | Admitting: Nurse Practitioner

## 2022-05-09 VITALS — BP 100/62 | HR 100 | Temp 97.3°F | Resp 15 | Ht 65.5 in | Wt 207.0 lb

## 2022-05-09 DIAGNOSIS — F900 Attention-deficit hyperactivity disorder, predominantly inattentive type: Secondary | ICD-10-CM

## 2022-05-09 DIAGNOSIS — F418 Other specified anxiety disorders: Secondary | ICD-10-CM

## 2022-05-09 DIAGNOSIS — F411 Generalized anxiety disorder: Secondary | ICD-10-CM

## 2022-05-09 DIAGNOSIS — F321 Major depressive disorder, single episode, moderate: Secondary | ICD-10-CM

## 2022-05-09 MED ORDER — DESVENLAFAXINE SUCCINATE ER 50 MG PO TB24: 50.0000 mg | ORAL_TABLET | Freq: Every day | ORAL | 1 refills | Status: DC

## 2022-05-09 MED ORDER — ATOMOXETINE HCL 40 MG PO CAPS: 40.0000 mg | ORAL_CAPSULE | Freq: Every day | ORAL | 1 refills | Status: DC

## 2022-05-09 NOTE — Progress Notes (Signed)
Subjective:  Patient ID: Bailey Serrano, female    DOB: 21-Dec-2000  Age: 22 y.o. MRN: [id number]  Chief Complaint  Patient presents with   Depression   Anxiety    HPI  Pt presents for follow-up of depression with anxiety management. She is not currently in counseling. States her current job has improved due to some staffing changes. Bailey Serrano would like to discuss ADHD medication management.    Anxiety, Follow-up  She was last seen for anxiety 4 weeks ago. Current treatment includes Pristiq 50 mg .   She reports good compliance with treatment. She reports good tolerance of treatment. She is having side effects, states she has had decreased concentration and insomnia She feels her anxiety is mild and Improved since last visit.   GAD-7 Results    05/09/2022    2:13 PM 04/11/2022    8:22 AM 02/26/2022    3:38 PM  GAD-7 Generalized Anxiety Disorder Screening Tool  1. Feeling Nervous, Anxious, or on Edge 0 3 1  2. Not Being Able to Stop or Control Worrying 0 3 1  3. Worrying Too Much About Different Things 0 3 2  4. Trouble Relaxing 0 2 1  5. Being So Restless it's Hard To Sit Still 0 3 1  6. Becoming Easily Annoyed or Irritable 1 2 2   7. Feeling Afraid As If Something Awful Might Happen 0 2 0  Total GAD-7 Score 1 18 8   Difficulty At Work, Home, or Getting  Along With Others? Not difficult at all  Somewhat difficult    PHQ-9 Scores    05/09/2022    1:59 PM 04/11/2022    8:36 AM 02/26/2022    3:42 PM  PHQ9 SCORE ONLY  PHQ-9 Total Score 7 16 7      Attention deficit disorder, inattentive type: Bailey Serrano had a positive ASRS screening for ADHD. 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 States she has difficulty concentrating and is easily distracted. She denies prescription medication to assist with ADHD symptoms. She is not a candidate for CNS stimulants due to history of chronic tachycardia.     Current  Outpatient Medications on File Prior to Visit  Medication Sig Dispense Refill   desvenlafaxine (PRISTIQ) 50 MG 24 hr tablet Take 1 tablet (50 mg total) by mouth daily. 30 tablet 0   No current facility-administered medications on file prior to visit.   Past Medical History:  Diagnosis Date   Patellofemoral syndrome    Tendonitis    Past Surgical History:  Procedure Laterality Date   GALLBLADDER SURGERY      Family History  Adopted: Yes  Problem Relation Age of Onset   Hypertension Mother    Alcohol abuse Mother    Drug abuse Mother    COPD Maternal Grandmother    Social History   Socioeconomic History   Marital status: Single    Spouse name: Not on file   Number of children: 0   Years of education: 12 + 4   Highest education level: Not on file  Occupational History   Occupation: [employer]  Tobacco Use   Smoking status: Never   Smokeless tobacco: Never  Vaping Use   Vaping Use: Every day   Substances: Nicotine, Flavoring  Substance and Sexual Activity   Alcohol use: Yes    Comment: rarely   Drug use: Never   Sexual activity: Yes    Partners: Male  Birth control/protection: None  Other Topics Concern   Not on file  Social History Narrative   Not on file   Social Determinants of Health   Financial Resource Strain: Low Risk  (02/26/2022)   Overall Financial Resource Strain (CARDIA)    Difficulty of Paying Living Expenses: Not hard at all  Food Insecurity: No Food Insecurity (02/26/2022)   Hunger Vital Sign    Worried About Running Out of Food in the Last Year: Never true    Ran Out of Food in the Last Year: Never true  Transportation Needs: No Transportation Needs (02/26/2022)   PRAPARE - Hydrologist (Medical): No    Lack of Transportation (Non-Medical): No  Physical Activity: Inactive (02/26/2022)   Exercise Vital Sign    Days of Exercise per Week: 0 days    Minutes of Exercise per Session: 0 min   Stress: No Stress Concern Present (02/26/2022)   [place]    Feeling of Stress : Not at all  Social Connections: Moderately Isolated (02/26/2022)   Social Connection and Isolation Panel [NHANES]    Frequency of Communication with Friends and Family: More than three times a week    Frequency of Social Gatherings with Friends and Family: More than three times a week    Attends Religious Services: Never    Marine scientist or Organizations: No    Attends Archivist Meetings: Never    Marital Status: Living with partner    Review of Systems  Constitutional:  Positive for appetite change (decreased). Negative for chills, fatigue and fever.  HENT:  Negative for congestion, ear pain and sore throat.   Respiratory:  Negative for cough and shortness of breath.   Cardiovascular:  Negative for chest pain and palpitations.  Gastrointestinal:  Negative for abdominal pain, constipation, diarrhea, nausea and vomiting.  Endocrine: Negative for polydipsia, polyphagia and polyuria.  Genitourinary:  Negative for difficulty urinating and dysuria.  Musculoskeletal:  Negative for arthralgias, back pain and myalgias.  Skin:  Negative for rash.  Neurological:  Negative for headaches.  Psychiatric/Behavioral:  Positive for decreased concentration and sleep disturbance (insomnia). Negative for dysphoric mood and suicidal ideas. The patient is not nervous/anxious.      Objective:  BP 100/62   Pulse 100   Temp (!) 97.3 F (36.3 C)   Resp 15   Ht 5' 5.5" (1.664 m)   Wt 207 lb (93.9 kg)   LMP 04/24/2022 (Approximate)   SpO2 98%   BMI 33.92 kg/m      05/09/2022    1:52 PM 04/11/2022    8:20 AM 03/15/2022    9:13 AM  BP/Weight  Systolic BP 062 694 854  Diastolic BP 62 70 82  Wt. (Lbs) 207 205 203  BMI 33.92 kg/m2 33.59 kg/m2 33.27 kg/m2    Physical Exam Vitals reviewed.  Constitutional:      Appearance: Normal  appearance.  HENT:     Right Ear: Tympanic membrane normal.     Left Ear: Tympanic membrane normal.  Cardiovascular:     Rate and Rhythm: Regular rhythm. Tachycardia present.     Pulses: Normal pulses.     Heart sounds: Normal heart sounds.  Pulmonary:     Effort: Pulmonary effort is normal.     Breath sounds: Normal breath sounds.  Abdominal:     General: Bowel sounds are normal.     Palpations: Abdomen is soft.  Skin:    General: Skin is warm and dry.     Capillary Refill: Capillary refill takes less than 2 seconds.  Neurological:     General: No focal deficit present.     Mental Status: She is alert and oriented to person, place, and time.  Psychiatric:        Mood and Affect: Mood normal.        Behavior: Behavior normal.         Lab Results  Component Value Date   WBC 11.1 03/14/2022   HGB 14.5 03/14/2022   HCT 43 03/14/2022   PLT 325 03/14/2022   GLUCOSE 97 03/14/2022   ALT 27 03/14/2022   AST 24 03/14/2022   NA 137 03/14/2022   K 3.5 03/14/2022   CL 106 03/14/2022   CREATININE 0.75 03/14/2022   BUN 10 03/14/2022   CO2 24 03/14/2022   TSH 0.782 02/26/2022      Assessment & Plan:    1. Depression with anxiety-well controlled -continue Pristiq 50 mg QD  2. Attention deficit hyperactivity disorder (ADHD), predominantly inattentive type - atomoxetine (STRATTERA) 40 MG capsule; Take 1 capsule (40 mg total) by mouth daily.  Dispense: 30 capsule; Refill: 1    Begin Strattera 40 mg for ADHD Stop medication immediately for any adverse side effects (worsening depression, suicidal thoughts) Recommend counseling Continue Pristiq 50 mg QD Follow-up in 2 weeks    Follow-up: 2-weeks  An After Visit Summary was printed and given to the patient.  I, Rip Harbour, NP, have reviewed all documentation for this visit. The documentation on 05/09/22 for the exam, diagnosis, procedures, and orders are all accurate and complete.   Signed, Rip Harbour,  NP [place] [phone]

## 2022-05-09 NOTE — Patient Instructions (Addendum)
Begin Strattera 40 mg for ADHD Stop medication immediately for any adverse side effects (worsening depression, suicidal thoughts) Recommend counseling Continue Pristiq 50 mg QD Follow-up in 2 weeks   Attention Deficit Hyperactivity Disorder, Adult Attention deficit hyperactivity disorder (ADHD) is a mental health disorder that starts during childhood. For many people with ADHD, the disorder continues into the adult years. Treatment can help you manage your symptoms. There are three main types of ADHD: Inattentive. With this type, adults have difficulty paying attention. This may affect cognitive abilities. Hyperactive-impulsive. With this type, adults have a lot of energy and have difficulty controlling their behavior. Combination type. Some people may have symptoms of both types. What are the causes? The exact cause of ADHD is not known. Most experts believe a person's genes and environment possibly contribute to ADHD. What increases the risk? The following factors may make you more likely to develop this condition: Having a first-degree relative such as a parent, brother, or sister, with the condition. Being born before 45 weeks of pregnancy (prematurely) or at a low birth weight. Being born to a mother who smoked tobacco or drank alcohol during pregnancy. Having experienced a brain injury. Being exposed to lead or other toxins in the womb or early in life. What are the signs or symptoms? Symptoms of this condition depend on the type of ADHD. Symptoms of the inattentive type include: Difficulty paying attention or following instructions. Often making simple mistakes. Being disorganized. Avoiding tasks that require time and attention. Losing and forgetting things. Symptoms of the hyperactive-impulsive type include: Restlessness. Talking out of turn, interrupting others, or talking too much. Difficulty with: Sitting still. Feeling motivated. Relaxing. Waiting in line or waiting  for a turn. People with the combination type have symptoms of both of the other types. In adults, this condition may lead to certain problems, such as: Keeping jobs. Performing tasks at work. Having stable relationships. Being on time or keeping to a schedule. How is this diagnosed? This condition is diagnosed based on your current symptoms and your history of symptoms. The diagnosis can be made by a health care provider such as a primary care provider or a mental health care specialist. Your health care provider may use a symptom checklist or a behavior rating scale to evaluate your symptoms. Your health care provider may also want to talk with people who have observed your behaviors throughout your life. How is this treated? This condition can be treated with medicines and behavior therapy. Medicines may be the best option to reduce impulsive behaviors and improve attention. Your health care provider may recommend: Stimulant medicines. These are the most common medicines used for adult ADHD. They affect certain chemicals in the brain (neurotransmitters) and improve your ability to control your symptoms. A non-stimulant medicine. These medicines can also improve focus, attention, and impulsive behavior. It may take weeks to months to see the effects of this medicine. Counseling and behavioral management are also important for treating ADHD. Counseling is often used along with medicine. Your health care provider may suggest: Cognitive behavioral therapy (CBT). This type of therapy teaches you to replace negative thoughts and actions with positive thoughts and actions. When used as part of ADHD treatment, this therapy may also include: Coping strategies for organization, time management, impulse control, and stress reduction. Mindfulness and meditation training. Behavioral management. You may work with a Leisure centre manager who is specially trained to help people with ADHD manage and organize activities and  function more effectively. Follow these instructions at  home: Medicines  Take over-the-counter and prescription medicines only as told by your health care provider. Talk with your health care provider about the possible side effects of your medicines and how to manage them. Alcohol use Do not drink alcohol if: Your health care provider tells you not to drink. You are pregnant, may be pregnant, or are planning to become pregnant. If you drink alcohol: Limit how much you use to: 0-1 drink a day for women. 0-2 drinks a day for men. Know how much alcohol is in your drink. In the [place] one drink equals one 12 oz bottle of beer (355 mL), one 5 oz glass of wine (148 mL), or one 1 oz glass of hard liquor (44 mL). Lifestyle  Do not use illegal drugs. Get enough sleep. Eat a healthy diet. Exercise regularly. Exercise can help to reduce stress and anxiety. General instructions Learn as much as you can about adult ADHD, and work closely with your health care providers to find the treatments that work best for you. Follow the same schedule each day. Use reminder devices like notes, calendars, and phone apps to stay on time and organized. Keep all follow-up visits. Your health care provider will need to monitor your condition and adjust your treatment over time. Where to find more information A health care provider may be able to recommend resources that are available online or over the phone. You could start with: Attention Deficit Disorder Association (ADDA): CondoFactory.com.cy National Institute of Mental Health [facility]): https://www.frey.org/ Contact a health care provider if: Your symptoms continue to cause problems. You have side effects from your medicine, such as: Repeated muscle twitches, coughing, or speech outbursts. Sleep problems. Loss of appetite. Dizziness. Unusually fast heartbeat. Stomach pains. Headaches. You are struggling with anxiety, depression, or substance abuse. Get help right away  if: You have a severe reaction to a medicine. This symptom may be an emergency. Get help right away. Call 911. Do not wait to see if the symptom will go away. Do not drive yourself to the hospital. Take one of these steps if you feel like you may hurt yourself or others, or have thoughts about taking your own life: Go to your nearest emergency room. Call 911. Call the [facility] at [phone] or 988. This is open 24 hours a day Text the Crisis Text Line at (920)512-4642. Summary ADHD is a mental health disorder that starts during childhood and often continues into your adult years. The exact cause of ADHD is not known. Most experts believe genetics and environmental factors contribute to ADHD. There is no cure for ADHD, but treatment with medicine, cognitive behavioral therapy, or behavioral management can help you manage your condition. This information is not intended to replace advice given to you by your health care provider. Make sure you discuss any questions you have with your health care provider. Document Revised: 08/10/2021 Document Reviewed: 08/10/2021 Elsevier Patient Education  [place] Sleep Information, Adult Quality sleep is important for your mental and physical health. It also improves your quality of life. Quality sleep means you: Are asleep for most of the time you are in bed. Fall asleep within 30 minutes. Wake up no more than once a night. Are awake for no longer than 20 minutes if you do wake up during the night. Most adults need 7-8 hours of quality sleep each night. How can poor sleep affect me? If you do not get enough quality sleep, you may  have: Mood swings. Daytime sleepiness. Decreased alertness, reaction time, and concentration. Sleep disorders, such as insomnia and sleep apnea. Difficulty with: Solving problems. Coping with stress. Paying attention. These issues may affect your performance and  productivity at work, school, and home. Lack of sleep may also put you at higher risk for accidents, suicide, and risky behaviors. If you do not get quality sleep, you may also be at higher risk for several health problems, including: Infections. Type 2 diabetes. Heart disease. High blood pressure. Obesity. Worsening of long-term conditions, like arthritis, kidney disease, depression, Parkinson's disease, and epilepsy. What actions can I take to get more quality sleep? Sleep schedule and routine Stick to a sleep schedule. Go to sleep and wake up at about the same time each day. Do not try to sleep less on weekdays and make up for lost sleep on weekends. This does not work. Limit naps during the day to 30 minutes or less. Do not take naps in the late afternoon. Make time to relax before bed. Reading, listening to music, or taking a hot bath promotes quality sleep. Make your bedroom a place that promotes quality sleep. Keep your bedroom dark, quiet, and at a comfortable room temperature. Make sure your bed is comfortable. Avoid using electronic devices that give off bright blue light for 30 minutes before bedtime. Your brain perceives bright blue light as sunlight. This includes television, phones, and computers. If you are lying awake in bed for longer than 20 minutes, get up and do a relaxing activity until you feel sleepy. Lifestyle     Try to get at least 30 minutes of exercise on most days. Do not exercise 2-3 hours before going to bed. Do not use any products that contain nicotine or tobacco. These products include cigarettes, chewing tobacco, and vaping devices, such as e-cigarettes. If you need help quitting, ask your health care provider. Do not drink caffeinated beverages for at least 8 hours before going to bed. Coffee, tea, and some sodas contain caffeine. Do not drink alcohol or eat large meals close to bedtime. Try to get at least 30 minutes of sunlight every day. Morning sunlight  is best. Medical concerns Work with your health care provider to treat medical conditions that may affect sleeping, such as: Nasal obstruction. Snoring. Sleep apnea and other sleep disorders. Talk to your health care provider if you think any of your prescription medicines may cause you to have difficulty falling or staying asleep. If you have sleep problems, talk with a sleep consultant. If you think you have a sleep disorder, talk with your health care provider about getting evaluated by a specialist. Where to find more information Sleep Foundation: sleepfoundation.org American Academy of Sleep Medicine: aasm.org Centers for Disease Control and Prevention (CDC): TonerPromos.no Contact a health care provider if: You have trouble getting to sleep or staying asleep. You often wake up very early in the morning and cannot get back to sleep. You have daytime sleepiness. You have daytime sleep attacks of suddenly falling asleep and sudden muscle weakness (narcolepsy). You have a tingling sensation in your legs with a strong urge to move your legs (restless legs syndrome). You stop breathing briefly during sleep (sleep apnea). You think you have a sleep disorder or are taking a medicine that is affecting your quality of sleep. Summary Most adults need 7-8 hours of quality sleep each night. Getting enough quality sleep is important for your mental and physical health. Make your bedroom a place that promotes quality  sleep, and avoid things that may cause you to have poor sleep, such as alcohol, caffeine, smoking, or large meals. Talk to your health care provider if you have trouble falling asleep or staying asleep. This information is not intended to replace advice given to you by your health care provider. Make sure you discuss any questions you have with your health care provider. Document Revised: 08/15/2021 Document Reviewed: 08/15/2021 Elsevier Patient Education  Bailey Serrano.

## 2022-05-29 ENCOUNTER — Ambulatory Visit: Payer: BC Managed Care – PPO | Admitting: Nurse Practitioner

## 2022-05-29 ENCOUNTER — Encounter: Payer: Self-pay | Admitting: Nurse Practitioner

## 2022-05-29 ENCOUNTER — Other Ambulatory Visit: Payer: Self-pay | Admitting: Nurse Practitioner

## 2022-05-29 VITALS — BP 120/70 | HR 96 | Temp 97.6°F | Ht 65.5 in | Wt 204.0 lb

## 2022-05-29 DIAGNOSIS — F900 Attention-deficit hyperactivity disorder, predominantly inattentive type: Secondary | ICD-10-CM | POA: Diagnosis not present

## 2022-05-29 MED ORDER — ATOMOXETINE HCL 40 MG PO CAPS: 40.0000 mg | ORAL_CAPSULE | Freq: Every day | ORAL | 2 refills | Status: DC

## 2022-05-29 NOTE — Patient Instructions (Addendum)
Continue Strattera 40 mg daily  Follow up in 6 months or sooner if needed  Living With Attention Deficit Hyperactivity Disorder If you have been diagnosed with attention deficit hyperactivity disorder (ADHD), you may be relieved that you now know why you have felt or behaved a certain way. Still, you may feel overwhelmed about the treatment ahead. You may also wonder how to get the support you need and how to deal with the condition day-to-day. With treatment and support, you can live with ADHD and manage your symptoms. How to manage lifestyle changes Managing lifestyle changes can be challenging. Seeking support from your healthcare provider, therapist, family, and friends can be helpful. How to recognize changes in your condition The following signs may mean that your treatment is working well and your condition is improving: Consistently being on time for appointments. Being more organized at home and work. Other people noticing improvements in your behavior. Achieving goals that you set for yourself. Thinking more clearly. The following signs may mean that your treatment is not working very well: Feeling impatience or more confusion. Missing, forgetting, or being late for appointments. An increasing sense of disorganization and messiness. More difficulty in reaching goals that you set for yourself. Loved ones becoming angry or frustrated with you. Follow these instructions at home: Medicines Take over-the-counter and prescription medicines only as told by your health care provider. Check with your health care provider before taking any new medicines. General instructions Create structure and an organized atmosphere at home. For example: Make a list of tasks, then rank them from most important to least important. Work on one task at a time until your listed tasks are done. Make a daily schedule and follow it consistently every day. Use an appointment calendar, and check it 2-3 times a  day to keep on track. Keep it with you when you leave the house. Create spaces where you keep certain things, and always put things back in their places after you use them. Keep all follow-up visits. Your health care provider will need to monitor your condition and adjust your treatment over time. Where to find support Talking to others  Keep emotion out of important discussions and speak in a calm, logical way. Listen closely and patiently to your loved ones. Try to understand their point of view, and try to avoid getting defensive. Take responsibility for the consequences of your actions. Ask that others do not take your behaviors personally. Aim to solve problems as they come up, and express your feelings instead of bottling them up. Talk openly about what you need from your loved ones and how they can support you. Consider going to family therapy sessions or having your family meet with a specialist who deals with ADHD-related behavior problems. Finances Not all insurance plans cover mental health care, so it is important to check with your insurance carrier. If paying for co-pays or counseling services is a problem, search for a local or county mental health care center. Public mental health care services may be offered there at a low cost or no cost when you are not able to see a private health care provider. If you are taking medicine for ADHD, you may be able to get the generic form, which may be less expensive than brand-name medicine. Some makers of prescription medicines also offer help to patients who cannot afford the medicines that they need. Therapy and support groups Talking with a mental health care provider and participating in support groups can help  to improve your quality of life, daily functioning, and overall symptoms. Questions to ask your health care provider: What are the risks and benefits of taking medicines? Would I benefit from therapy? How often should I follow up  with a health care provider? Where to find more information Learn more about ADHD from: Children and Adults with Attention Deficit Hyperactivity Disorder: chadd.[employer] of Mental Health: https://www.frey.org/ Centers for Disease Control and Prevention: StoreMirror.com.cy Contact a health care provider if: You have side effects from your medicines, such as: Repeated muscle twitches, coughing, or speech outbursts. Sleep problems. Loss of appetite. Dizziness. Unusually fast heartbeat. Stomach pains. Headaches. You have new or worsening behavior problems. You are struggling with anxiety, depression, or substance abuse. Get help right away if: You have a severe reaction to a medicine. These symptoms may be an emergency. Get help right away. Call 911. Do not wait to see if the symptoms will go away. Do not drive yourself to the hospital. Take one of these steps if you feel like you may hurt yourself or others, or have thoughts about taking your own life: Go to your nearest emergency room. Call 911. Call the [facility] at [phone] or 988. This is open 24 hours a day. Text the Crisis Text Line at 214-059-1042. Summary With treatment and support, you can live with ADHD and manage your symptoms. Consider taking part in family therapy or self-help groups with family members or friends. When you talk with friends and family about your ADHD, be patient and communicate openly. Keep all follow-up visits. Your health care provider will need to monitor your condition and adjust your treatment over time. This information is not intended to replace advice given to you by your health care provider. Make sure you discuss any questions you have with your health care provider. Document Revised: 08/10/2021 Document Reviewed: 08/10/2021 Elsevier Patient Education  [employer].

## 2022-05-29 NOTE — Progress Notes (Signed)
Subjective:  Patient ID: Bailey Serrano, female    DOB: 2000/12/19  Age: 22 y.o. MRN: [id number]  Chief Complaint  Patient presents with   ADHD    HPI   Attention deficit disorder, inattentive type: Verneice had a positive ASRS screening for ADHD. 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 Denies side effects of medication. She is not a candidate for CNS stimulants due to history of chronic tachycardia.        05/09/2022    1:59 PM 04/11/2022    8:36 AM 02/26/2022    3:42 PM 03/07/2021   10:10 AM 02/07/2021    1:45 PM  Depression screen PHQ 2/9  Decreased Interest 1 3 1  0 0  Down, Depressed, Hopeless 0 2 0 0 0  PHQ - 2 Score 1 5 1  0 0  Altered sleeping 2 2 2 1    Tired, decreased energy 1 3 3 3    Change in appetite 1 3 0 0   Feeling bad or failure about yourself  0 0 0 0   Trouble concentrating 2 3 1  0   Moving slowly or fidgety/restless 0 0 0 0   Suicidal thoughts 0  0 0   PHQ-9 Score 7 16 7 4    Difficult doing work/chores Somewhat difficult Very difficult Somewhat difficult Not difficult at all          03/07/2021   10:09 AM 04/04/2021    7:58 AM 10/16/2021    3:05 PM 03/14/2022    3:38 PM 05/29/2022    3:22 PM  [place] in the past year? 0 0 0  0  Was there an injury with Fall? 0 0 0  0  Fall Risk Category Calculator 0 0 0  0  Fall Risk Category (Retired) Low Low Low    (RETIRED) Patient Fall Risk Level Low fall risk Low fall risk Low fall risk Low fall risk   Patient at Risk for Falls Due to No Fall Risks No Fall Risks No Fall Risks  No Fall Risks  Fall risk Follow up Falls evaluation completed Falls evaluation completed Falls evaluation completed  Falls evaluation completed      Review of Systems  All other systems reviewed and are negative.   Current Outpatient Medications on File Prior to Visit  Medication Sig Dispense Refill   atomoxetine (STRATTERA) 40 MG capsule Take 1 capsule  (40 mg total) by mouth daily. 30 capsule 1   desvenlafaxine (PRISTIQ) 50 MG 24 hr tablet Take 1 tablet (50 mg total) by mouth daily. 90 tablet 1   No current facility-administered medications on file prior to visit.   Past Medical History:  Diagnosis Date   Patellofemoral syndrome    Tendonitis    Past Surgical History:  Procedure Laterality Date   GALLBLADDER SURGERY      Family History  Adopted: Yes  Problem Relation Age of Onset   Hypertension Mother    Alcohol abuse Mother    Drug abuse Mother    COPD Maternal Grandmother    Social History   Socioeconomic History   Marital status: Single    Spouse name: Not on file   Number of children: 0   Years of education: 12 + 4   Highest education level: Not on file  Occupational History   Occupation: [employer]  Tobacco Use   Smoking status: Never   Smokeless tobacco:  Never  Vaping Use   Vaping Use: Every day   Substances: Nicotine, Flavoring  Substance and Sexual Activity   Alcohol use: Yes    Comment: rarely   Drug use: Never   Sexual activity: Yes    Partners: Male    Birth control/protection: None  Other Topics Concern   Not on file  Social History Narrative   Not on file   Social Determinants of Health   Financial Resource Strain: Low Risk  (02/26/2022)   Overall Financial Resource Strain (CARDIA)    Difficulty of Paying Living Expenses: Not hard at all  Food Insecurity: No Food Insecurity (02/26/2022)   Hunger Vital Sign    Worried About Running Out of Food in the Last Year: Never true    Ran Out of Food in the Last Year: Never true  Transportation Needs: No Transportation Needs (02/26/2022)   PRAPARE - Administrator, Civil Service (Medical): No    Lack of Transportation (Non-Medical): No  Physical Activity: Inactive (02/26/2022)   Exercise Vital Sign    Days of Exercise per Week: 0 days    Minutes of Exercise per Session: 0 min  Stress: No Stress Concern Present  (02/26/2022)   [employer] of Occupational Health - Occupational Stress Questionnaire    Feeling of Stress : Not at all  Social Connections: Moderately Isolated (02/26/2022)   Social Connection and Isolation Panel [NHANES]    Frequency of Communication with Friends and Family: More than three times a week    Frequency of Social Gatherings with Friends and Family: More than three times a week    Attends Religious Services: Never    Database administrator or Organizations: No    Attends Engineer, structural: Never    Marital Status: Living with partner    Objective:  BP 120/70   Pulse 96   Temp 97.6 F (36.4 C)   Ht 5' 5.5" (1.664 m)   Wt 204 lb (92.5 kg)   LMP 04/24/2022 (Approximate)   SpO2 99%   BMI 33.43 kg/m       05/29/2022    3:20 PM 05/09/2022    1:52 PM 04/11/2022    8:20 AM  BP/Weight  Systolic BP  100 417  Diastolic BP  62 70  Wt. (Lbs) 204 207 205  BMI 33.43 kg/m2 33.92 kg/m2 33.59 kg/m2    Physical Exam Vitals reviewed.  Constitutional:      Appearance: She is obese.  Skin:    General: Skin is warm.     Capillary Refill: Capillary refill takes less than 2 seconds.  Neurological:     Mental Status: She is alert and oriented to person, place, and time.  Psychiatric:        Mood and Affect: Mood normal.        Behavior: Behavior normal.         Lab Results  Component Value Date   WBC 11.1 03/14/2022   HGB 14.5 03/14/2022   HCT 43 03/14/2022   PLT 325 03/14/2022   GLUCOSE 97 03/14/2022   ALT 27 03/14/2022   AST 24 03/14/2022   NA 137 03/14/2022   K 3.5 03/14/2022   CL 106 03/14/2022   CREATININE 0.75 03/14/2022   BUN 10 03/14/2022   CO2 24 03/14/2022   TSH 0.782 02/26/2022      Assessment & Plan:    1. Attention deficit hyperactivity disorder (ADHD), predominantly inattentive type - atomoxetine (STRATTERA) 40 MG  capsule; Take 1 capsule (40 mg total) by mouth daily.  Dispense: 90 capsule; Refill: 2    Continue Strattera  40 mg daily  Follow up in 3 months or sooner if needed  Follow-up: 76-months  An After Visit Summary was printed and given to the patient.  I, Rip Harbour, NP, have reviewed all documentation for this visit. The documentation on 05/29/22 for the exam, diagnosis, procedures, and orders are all accurate and complete.   Rip Harbour, NP [place] [phone]

## 2022-08-28 ENCOUNTER — Ambulatory Visit (INDEPENDENT_AMBULATORY_CARE_PROVIDER_SITE_OTHER): Payer: BC Managed Care – PPO | Admitting: Physician Assistant

## 2022-08-28 ENCOUNTER — Other Ambulatory Visit: Payer: Self-pay | Admitting: Physician Assistant

## 2022-08-28 ENCOUNTER — Encounter: Payer: Self-pay | Admitting: Physician Assistant

## 2022-08-28 VITALS — BP 100/60 | HR 83 | Temp 97.3°F | Resp 12 | Ht 65.5 in | Wt 209.0 lb

## 2022-08-28 DIAGNOSIS — Z7689 Persons encountering health services in other specified circumstances: Secondary | ICD-10-CM

## 2022-08-28 DIAGNOSIS — F418 Other specified anxiety disorders: Secondary | ICD-10-CM

## 2022-08-28 DIAGNOSIS — Z8669 Personal history of other diseases of the nervous system and sense organs: Secondary | ICD-10-CM

## 2022-08-28 DIAGNOSIS — F900 Attention-deficit hyperactivity disorder, predominantly inattentive type: Secondary | ICD-10-CM

## 2022-08-28 MED ORDER — TOPIRAMATE 25 MG PO TABS: ORAL_TABLET | ORAL | 0 refills | Status: DC

## 2022-08-28 NOTE — Progress Notes (Signed)
Established Patient Office Visit  Subjective:  Patient ID: Bailey Serrano, female    DOB: 2001-05-04  Age: 22 y.o. MRN: [id number]  CC:  Chief Complaint  Patient presents with   ADHD   Weight Loss    HPI Bailey Serrano presents for follow up of ADD - pt was put on strattera several months ago and states medication working well for her.  She is currently on 40mg  qd. She had stopped the medication for about a month but could definitely tell she needed to start again   Pt with history of anxiety with depression - has been on pristiq for over a year and states works well for her.  Is not having any breakthrough symptoms  Pt would like to try medication for weight loss.  She had tried wegovy but that made her nauseated and now insurance not paying for the injectable medications.  She states she tries to watch her diet and occasionally exercises but not on any particular diet or exercise regimen.   She had wanted to try adipex which worked in the past however she has history of tachycardia and this medication is contraindicated with that history I mentioned trying topamax and she also gives history of migraine headaches in the past and usually has 1-2 headaches per week - hopefully this medication would help with those symptoms as well  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[employer]  Tobacco Use   Smoking status: Never   Smokeless tobacco: Never   Vaping Use   Vaping Use: Every day   Substances: Nicotine, Flavoring  Substance and Sexual Activity   Alcohol use: Yes    Comment: rarely   Drug use: Never   Sexual activity: Yes    Partners: Male    Birth control/protection: Condom  Other Topics Concern   Not on file  Social History Narrative   Not on file   Social Determinants of Health   Financial Resource Strain: Low Risk  (02/26/2022)   Overall Financial Resource Strain (CARDIA)    Difficulty of Paying Living Expenses: Not hard at all  Food Insecurity: No Food Insecurity (02/26/2022)   Hunger Vital Sign    Worried About Running Out of Food in the Last Year: Never true    Ran Out of Food in the Last Year: Never true  Transportation Needs: No Transportation Needs (02/26/2022)   PRAPARE - Administrator, Civil Service (Medical): No    Lack of Transportation (Non-Medical): No  Physical Activity: Inactive (02/26/2022)   Exercise Vital Sign    Days of Exercise per Week: 0 days    Minutes of Exercise per Session: 0 min  Stress: No Stress Concern Present (02/26/2022)   [employer] of Occupational Health - Occupational Stress Questionnaire    Feeling of Stress : Not at all  Social Connections: Moderately Isolated (02/26/2022)   Social Connection and Isolation Panel [NHANES]    Frequency of Communication with Friends and Family: More than three times a week    Frequency of Social Gatherings with Friends and Family: More than three times a week    Attends Religious Services: Never    Database administrator or Organizations: No    Attends Banker Meetings: Never    Marital Status: Living with partner  Intimate Partner Violence: Not At Risk (02/26/2022)   Humiliation, Afraid, Rape, and Kick questionnaire    Fear of Current or Ex-Partner: No    Emotionally Abused: No    Physically Abused: No    Sexually Abused: No     Current Outpatient Medications:    atomoxetine (STRATTERA) 40 MG capsule,  Take 1 capsule (40 mg total) by mouth daily., Disp: 90 capsule, Rfl: 2   desvenlafaxine (PRISTIQ) 50 MG 24 hr tablet, Take 1 tablet (50 mg total) by mouth daily., Disp: 90 tablet, Rfl: 1   topiramate (TOPAMAX) 25 MG tablet, 1 po qhs for 2 weeks then 2 po qhs, Disp: 45 tablet, Rfl: 0   Allergies  Allergen Reactions   Lexapro [Escitalopram] Nausea Only    Worsening of symptoms    ROS CONSTITUTIONAL: Negative for chills, fatigue, fever, unintentional weight gain and unintentional weight loss.  CARDIOVASCULAR: has history of tachycardia RESPIRATORY: Negative for recent cough and dyspnea.  GASTROINTESTINAL: Negative for abdominal pain, acid reflux symptoms, constipation, diarrhea, nausea and vomiting.  INTEGUMENTARY: Negative for rash.  NEUROLOGICAL: see HPI PSYCHIATRIC: Negative for sleep disturbance and to question depression screen.  Negative for depression, negative for anhedonia.        Objective:    PHYSICAL EXAM:   VS: BP 100/60   Pulse 83   Temp (!) 97.3 F (36.3 C)   Resp 12   Ht 5' 5.5" (1.664 m)   Wt 209 lb (94.8 kg)   SpO2 97%   BMI 34.25 kg/m   GEN: Well nourished, well developed, in no acute distress  Cardiac: RRR; no murmurs, rubs, or gallops,no edema -  Respiratory:  normal respiratory rate and pattern with no distress - normal breath sounds with no rales, rhonchi, wheezes or rubs Psych: euthymic mood, appropriate affect and demeanor     Health Maintenance Due  Topic Date Due   DTaP/Tdap/Td (1 - Tdap) Never done   CHLAMYDIA SCREENING  12/16/2019   PAP-Cervical Cytology Screening  Never done   PAP SMEAR-Modifier  Never done    There are no preventive care reminders to display for this patient.  Lab Results  Component Value Date   TSH 0.782 02/26/2022   Lab Results  Component Value Date   WBC 11.1 03/14/2022   HGB 14.5 03/14/2022   HCT 43 03/14/2022   MCV 87 02/26/2022   PLT 325 03/14/2022   Lab Results  Component Value Date   NA 137  03/14/2022   K 3.5 03/14/2022   CO2 24 03/14/2022   GLUCOSE 97 03/14/2022   BUN 10 03/14/2022   CREATININE 0.75 03/14/2022   BILITOT 0.8 03/14/2022   ALKPHOS 53 03/14/2022   AST 24 03/14/2022   ALT 27 03/14/2022   PROT 7.4 03/14/2022   ALBUMIN 4.1 03/14/2022   CALCIUM 9.0 03/14/2022   ANIONGAP 7 03/14/2022   EGFR 115 02/26/2022   No results found for: "CHOL" No results found for: "HDL" No results found for: "LDLCALC" No results found for: "TRIG" No results  found for: "CHOLHDL" No results found for: "HGBA1C"    Assessment & Plan:   Problem List Items Addressed This Visit       Other   Attention deficit hyperactivity disorder (ADHD), predominantly inattentive type - Primary Continue strattera   Encounter for weight management   Relevant Medications   topiramate (TOPAMAX) 25 MG tablet Better efforts at diet and exercise   History of migraine   Relevant Medications   topiramate (TOPAMAX) 25 MG tablet   Depression with anxiety Continue pristiq    Meds ordered this encounter  Medications   topiramate (TOPAMAX) 25 MG tablet    Sig: 1 po qhs for 2 weeks then 2 po qhs    Dispense:  45 tablet    Refill:  0    Order Specific Question:   Supervising Provider    AnswerCorey Harold    Follow-up: Return in about 2 months (around 10/28/2022) for follow up.    SARA R Mabry Santarelli, PA-C

## 2022-09-23 ENCOUNTER — Other Ambulatory Visit: Payer: Self-pay | Admitting: Physician Assistant

## 2022-09-23 DIAGNOSIS — Z7689 Persons encountering health services in other specified circumstances: Secondary | ICD-10-CM

## 2022-09-23 DIAGNOSIS — Z8669 Personal history of other diseases of the nervous system and sense organs: Secondary | ICD-10-CM

## 2022-10-29 ENCOUNTER — Ambulatory Visit (INDEPENDENT_AMBULATORY_CARE_PROVIDER_SITE_OTHER): Payer: BC Managed Care – PPO | Admitting: Physician Assistant

## 2022-10-29 ENCOUNTER — Encounter: Payer: Self-pay | Admitting: Physician Assistant

## 2022-10-29 VITALS — BP 102/62 | HR 113 | Temp 97.6°F | Ht 65.5 in | Wt 219.2 lb

## 2022-10-29 DIAGNOSIS — Z7689 Persons encountering health services in other specified circumstances: Secondary | ICD-10-CM

## 2022-10-29 DIAGNOSIS — F900 Attention-deficit hyperactivity disorder, predominantly inattentive type: Secondary | ICD-10-CM

## 2022-10-29 DIAGNOSIS — F411 Generalized anxiety disorder: Secondary | ICD-10-CM

## 2022-10-29 DIAGNOSIS — Z8669 Personal history of other diseases of the nervous system and sense organs: Secondary | ICD-10-CM | POA: Diagnosis not present

## 2022-10-29 DIAGNOSIS — F321 Major depressive disorder, single episode, moderate: Secondary | ICD-10-CM

## 2022-10-29 MED ORDER — ATOMOXETINE HCL 40 MG PO CAPS: 40.0000 mg | ORAL_CAPSULE | Freq: Every day | ORAL | 1 refills | Status: DC

## 2022-10-29 MED ORDER — DESVENLAFAXINE SUCCINATE ER 50 MG PO TB24: 50.0000 mg | ORAL_TABLET | Freq: Every day | ORAL | 1 refills | Status: DC

## 2022-10-29 MED ORDER — TOPIRAMATE 50 MG PO TABS
ORAL_TABLET | ORAL | 1 refills | Status: DC
Start: 2022-10-29 — End: 2023-12-01

## 2022-10-29 NOTE — Progress Notes (Signed)
Established Patient Office Visit  Subjective:  Patient ID: Bailey Serrano, female    DOB: 2000-05-26  Age: 22 y.o. MRN: [id number]  CC:  Chief Complaint  Patient presents with   Medical Management of Chronic Issues    HPI Bailey Serrano presents for follow up of ADD - she is doing well since restarting strattera 40mg  every day - does request refill of medication  Pt with history of anxiety with depression - has been on pristiq for over a year and states works well for her.  Is not having any breakthrough symptoms - currently on 50mg  every day - requests refill  Pt with history of chronic recurrent migraine headaches - since starting topamax she has noted moderate decrease of frequency of headaches.  She is tolerating medication well.  Was hoping it would also help with weight loss but has not seen much difference.    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[employer]  Tobacco Use   Smoking status: Never   Smokeless tobacco: Never  Vaping Use   Vaping Use: Every day   Substances: Nicotine, Flavoring  Substance and Sexual Activity   Alcohol use: Yes    Comment: rarely   Drug use: Never   Sexual activity: Yes    Partners: Male    Birth control/protection: Condom  Other Topics Concern   Not on file  Social History Narrative   Not on file   Social Determinants of Health   Financial Resource Strain: Low Risk  (02/26/2022)   Overall  Financial Resource Strain (CARDIA)    Difficulty of Paying Living Expenses: Not hard at all  Food Insecurity: No Food Insecurity (02/26/2022)   Hunger Vital Sign    Worried About Running Out of Food in the Last Year: Never true    Ran Out of Food in the Last Year: Never true  Transportation Needs: No Transportation Needs (02/26/2022)   PRAPARE - Administrator, Civil Service (Medical): No    Lack of Transportation (Non-Medical): No  Physical Activity: Inactive (02/26/2022)   Exercise Vital Sign    Days of Exercise per Week: 0 days    Minutes of Exercise per Session: 0 min  Stress: No Stress Concern Present (02/26/2022)   [employer] of Occupational Health - Occupational Stress Questionnaire    Feeling of Stress : Not at all  Social Connections: Moderately Isolated (02/26/2022)   Social Connection and Isolation Panel [NHANES]    Frequency of Communication with Friends and Family: More than three times a week    Frequency of Social Gatherings with Friends and Family: More than three times a week    Attends Religious Services: Never    Database administrator or Organizations: No    Attends Banker Meetings: Never    Marital Status: Living with  partner  Intimate Partner Violence: Not At Risk (02/26/2022)   Humiliation, Afraid, Rape, and Kick questionnaire    Fear of Current or Ex-Partner: No    Emotionally Abused: No    Physically Abused: No    Sexually Abused: No     Current Outpatient Medications:    cetirizine (ZYRTEC) 10 MG tablet, Take by mouth., Disp: , Rfl:    famotidine (PEPCID) 20 MG tablet, Take by mouth., Disp: , Rfl:    predniSONE (DELTASONE) 20 MG tablet, Take by mouth., Disp: , Rfl:    atomoxetine (STRATTERA) 40 MG capsule, Take 1 capsule (40 mg total) by mouth daily., Disp: 90 capsule, Rfl: 1   desvenlafaxine (PRISTIQ) 50 MG 24 hr tablet, Take 1 tablet (50 mg total) by mouth daily., Disp: 90 tablet, Rfl: 1   topiramate (TOPAMAX) 50 MG  tablet, One oral twice daily, Disp: 180 tablet, Rfl: 1   Allergies  Allergen Reactions   Lexapro [Escitalopram] Nausea Only    Worsening of symptoms    CONSTITUTIONAL: Negative for chills, fatigue, fever, E/N/T: Negative for ear pain, nasal congestion and sore throat.  CARDIOVASCULAR: Negative for chest pain, dizziness,  RESPIRATORY: Negative for recent cough and dyspnea.  GASTROINTESTINAL: Negative for abdominal pain, acid reflux symptoms, constipation, diarrhea, nausea and vomiting.   NEUROLOGICAL:see HPI PSYCHIATRIC: Negative for sleep disturbance and to question depression screen.  Negative for depression, negative for anhedonia.        Objective:  PHYSICAL EXAM:   VS: BP 102/62 (BP Location: Left Arm, Patient Position: Sitting, Cuff Size: Large)   Pulse (!) 113   Temp 97.6 F (36.4 C) (Temporal)   Ht 5' 5.5" (1.664 m)   Wt 219 lb 3.2 oz (99.4 kg)   SpO2 98%   BMI 35.92 kg/m   GEN: Well nourished, well developed, in no acute distress  Cardiac: RRR; no murmurs, rubs, or gallops,no edema -  Respiratory:  normal respiratory rate and pattern with no distress - normal breath sounds with no rales, rhonchi, wheezes or rubs Psych: euthymic mood, appropriate affect and demeanor   Health Maintenance Due  Topic Date Due   DTaP/Tdap/Td (1 - Tdap) Never done   CHLAMYDIA SCREENING  12/16/2019   PAP-Cervical Cytology Screening  Never done   PAP SMEAR-Modifier  Never done    There are no preventive care reminders to display for this patient.  Lab Results  Component Value Date   TSH 0.782 02/26/2022   Lab Results  Component Value Date   WBC 11.1 03/14/2022   HGB 14.5 03/14/2022   HCT 43 03/14/2022   MCV 87 02/26/2022   PLT 325 03/14/2022   Lab Results  Component Value Date   NA 137 03/14/2022   K 3.5 03/14/2022   CO2 24 03/14/2022   GLUCOSE 97 03/14/2022   BUN 10 03/14/2022   CREATININE 0.75 03/14/2022   BILITOT 0.8 03/14/2022   ALKPHOS 53 03/14/2022   AST 24  03/14/2022   ALT 27 03/14/2022   PROT 7.4 03/14/2022   ALBUMIN 4.1 03/14/2022   CALCIUM 9.0 03/14/2022   ANIONGAP 7 03/14/2022   EGFR 115 02/26/2022   No results found for: "CHOL" No results found for: "HDL" No results found for: "LDLCALC" No results found for: "TRIG" No results found for: "CHOLHDL" No results found for: "HGBA1C"    Assessment & Plan:   Problem List Items Addressed This Visit       Other   Attention deficit hyperactivity disorder (ADHD), predominantly inattentive type -  Primary Continue strattera   Encounter for weight management   Relevant Medications   Continue topamax Better efforts at diet and exercise   History of migraine   Relevant Medications   Increase topamax to 50mg  bid   Depression with anxiety Continue pristiq 50mg     Meds ordered this encounter  Medications   topiramate (TOPAMAX) 50 MG tablet    Sig: One oral twice daily    Dispense:  180 tablet    Refill:  1    Order Specific Question:   Supervising Provider    Answer:   Corey Harold   atomoxetine (STRATTERA) 40 MG capsule    Sig: Take 1 capsule (40 mg total) by mouth daily.    Dispense:  90 capsule    Refill:  1    Order Specific Question:   Supervising Provider    Answer:   COX, Aniceto Boss   desvenlafaxine (PRISTIQ) 50 MG 24 hr tablet    Sig: Take 1 tablet (50 mg total) by mouth daily.    Dispense:  90 tablet    Refill:  1    Order Specific Question:   Supervising Provider    Answer:   Corey Harold    Follow-up: Return in about 4 months (around 02/28/2023) for follow-up.    SARA R Armani Gawlik, PA-C

## 2023-03-04 ENCOUNTER — Ambulatory Visit: Payer: BC Managed Care – PPO | Admitting: Physician Assistant

## 2023-03-04 ENCOUNTER — Encounter: Payer: Self-pay | Admitting: Physician Assistant

## 2023-03-04 VITALS — BP 100/72 | HR 96 | Resp 16 | Ht 65.5 in | Wt 222.8 lb

## 2023-03-04 DIAGNOSIS — F411 Generalized anxiety disorder: Secondary | ICD-10-CM

## 2023-03-04 DIAGNOSIS — Z23 Encounter for immunization: Secondary | ICD-10-CM | POA: Diagnosis not present

## 2023-03-04 DIAGNOSIS — F900 Attention-deficit hyperactivity disorder, predominantly inattentive type: Secondary | ICD-10-CM

## 2023-03-04 DIAGNOSIS — Z8669 Personal history of other diseases of the nervous system and sense organs: Secondary | ICD-10-CM

## 2023-03-04 NOTE — Progress Notes (Signed)
Established Patient Office Visit  Subjective:  Patient ID: Bailey Serrano, female    DOB: Feb 04, 2001  Age: 21 y.o. MRN: [id number]  CC:  Chief Complaint  Patient presents with   Medical Management of Chronic Issues    35M    HPI Bailey Serrano presents for follow up of ADD - she is doing well on strattera 40mg  every day -   Pt with history of anxiety with depression - has been on pristiq for over a year and states works well for her.  Is not having any breakthrough symptoms - currently on 50mg  every day -   Pt with history of chronic recurrent migraine headaches - since starting topamax she has noted moderate decrease of frequency of headaches.  She is tolerating medication well.    She did actually stop pristiq and strattera for a few weeks but noted increased headaches at that time - will restart meds because she feels she does need them  Pt would like tdap today 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[employer]  Tobacco Use   Smoking status: Never   Smokeless tobacco: Never  Vaping Use   Vaping status: Every Day   Substances: Nicotine, Flavoring  Substance and Sexual Activity   Alcohol use: Yes    Comment: rarely   Drug use: Never   Sexual activity: Yes    Partners: Male    Birth control/protection: Condom  Other Topics Concern   Not on file  Social History Narrative   Not on file   Social Determinants of Health   Financial  Resource Strain: Low Risk  (02/26/2022)   Overall Financial Resource Strain (CARDIA)    Difficulty of Paying Living Expenses: Not hard at all  Food Insecurity: No Food Insecurity (02/26/2022)   Hunger Vital Sign    Worried About Running Out of Food in the Last Year: Never true    Ran Out of Food in the Last Year: Never true  Transportation Needs: No Transportation Needs (03/04/2023)   PRAPARE - Administrator, Civil Service (Medical): No    Lack of Transportation (Non-Medical): No  Physical Activity: Inactive (02/26/2022)   Exercise Vital Sign    Days of Exercise per Week: 0 days    Minutes of Exercise per Session: 0 min  Stress: No Stress Concern Present (02/26/2022)   [employer] of Occupational Health - Occupational Stress Questionnaire    Feeling of Stress : Not at all  Social Connections: Moderately Isolated (02/26/2022)   Social Connection and Isolation Panel [NHANES]    Frequency of Communication with Friends and Family: More than three times a week    Frequency of Social Gatherings with Friends and Family: More than three times a week    Attends Religious Services: Never    Database administrator or Organizations:  No    Attends Club or Organization Meetings: Never    Marital Status: Living with partner  Intimate Partner Violence: Not At Risk (03/04/2023)   Humiliation, Afraid, Rape, and Kick questionnaire    Fear of Current or Ex-Partner: No    Emotionally Abused: No    Physically Abused: No    Sexually Abused: No     Current Outpatient Medications:    atomoxetine (STRATTERA) 40 MG capsule, Take 1 capsule (40 mg total) by mouth daily., Disp: 90 capsule, Rfl: 1   desvenlafaxine (PRISTIQ) 50 MG 24 hr tablet, Take 1 tablet (50 mg total) by mouth daily., Disp: 90 tablet, Rfl: 1   topiramate (TOPAMAX) 50 MG tablet, One oral twice daily, Disp: 180 tablet, Rfl: 1   Allergies  Allergen Reactions   Lexapro [Escitalopram] Nausea Only    Worsening of symptoms     CONSTITUTIONAL: Negative for chills, fatigue, fever,  CARDIOVASCULAR: Negative for chest pain, dizziness, palpitations  RESPIRATORY: Negative for recent cough and dyspnea.  GASTROINTESTINAL: Negative for abdominal pain, acid reflux symptoms, constipation, diarrhea, nausea and vomiting.   NEUROLOGICAL: Negative for dizziness and headaches.  PSYCHIATRIC: Negative for sleep disturbance and to question depression screen.  Negative for depression, negative for anhedonia.        Objective:  PHYSICAL EXAM:   VS: BP 100/72   Pulse 96   Resp 16   Ht 5' 5.5" (1.664 m)   Wt 222 lb 12.8 oz (101.1 kg)   SpO2 98%   BMI 36.51 kg/m   GEN: Well nourished, well developed, in no acute distress   Cardiac: RRR; no murmurs,  Respiratory:  normal respiratory rate and pattern with no distress - normal breath sounds with no rales, rhonchi, wheezes or rubs  Psych: euthymic mood, appropriate affect and demeanor   Health Maintenance Due  Topic Date Due   DTaP/Tdap/Td (1 - Tdap) Never done   CHLAMYDIA SCREENING  12/16/2019   Cervical Cancer Screening (Pap smear)  Never done    There are no preventive care reminders to display for this patient.  Lab Results  Component Value Date   TSH 0.782 02/26/2022   Lab Results  Component Value Date   WBC 11.1 03/14/2022   HGB 14.5 03/14/2022   HCT 43 03/14/2022   MCV 87 02/26/2022   PLT 325 03/14/2022   Lab Results  Component Value Date   NA 137 03/14/2022   K 3.5 03/14/2022   CO2 24 03/14/2022   GLUCOSE 97 03/14/2022   BUN 10 03/14/2022   CREATININE 0.75 03/14/2022   BILITOT 0.8 03/14/2022   ALKPHOS 53 03/14/2022   AST 24 03/14/2022   ALT 27 03/14/2022   PROT 7.4 03/14/2022   ALBUMIN 4.1 03/14/2022   CALCIUM 9.0 03/14/2022   ANIONGAP 7 03/14/2022   EGFR 115 02/26/2022   No results found for: "CHOL" No results found for: "HDL" No results found for: "LDLCALC" No results found for: "TRIG" No results found for: "CHOLHDL" No  results found for: "HGBA1C"    Assessment & Plan:   Problem List Items Addressed This Visit       Other   Attention deficit hyperactivity disorder (ADHD), predominantly inattentive type - Primary Continue strattera            History of migraine   Relevant Medications   Continue  topamax to 50mg  bid   Depression with anxiety Continue pristiq 50mg     No orders of the defined types were placed in this encounter.  Follow-up: Return in about 6 months (around 09/02/2023) for follow-up.    SARA R Karelyn Brisby, PA-C

## 2023-03-11 ENCOUNTER — Telehealth: Payer: Self-pay

## 2023-03-11 NOTE — Telephone Encounter (Signed)
I left a message on the number(s) listed in the patients chart requesting the patient to call back regarding the upcomming appointment for 09/10/2023. The provider is out of the office that day. The appointment has been canceled. Waiting for the patient to return the call.  NOTE: If the patient does not call back within a week to reschedule this appointment, the front staff will mail the patient a letter requesting to call the office back.

## 2023-03-18 ENCOUNTER — Encounter: Payer: Self-pay | Admitting: Physician Assistant

## 2023-03-18 NOTE — Telephone Encounter (Signed)
I have mailed the patient a letter requesting her to call the office to reschedule the appointment.

## 2023-09-10 ENCOUNTER — Ambulatory Visit: Payer: BC Managed Care – PPO | Admitting: Physician Assistant

## 2023-09-15 ENCOUNTER — Ambulatory Visit: Admitting: Physician Assistant

## 2023-09-16 ENCOUNTER — Ambulatory Visit: Admitting: Physician Assistant

## 2023-09-16 ENCOUNTER — Encounter: Payer: Self-pay | Admitting: Physician Assistant

## 2023-09-16 ENCOUNTER — Ambulatory Visit: Payer: BC Managed Care – PPO | Admitting: Physician Assistant

## 2023-09-16 VITALS — BP 110/70 | HR 90 | Temp 97.7°F | Resp 18 | Ht 65.5 in | Wt 221.4 lb

## 2023-09-16 DIAGNOSIS — F411 Generalized anxiety disorder: Secondary | ICD-10-CM

## 2023-09-16 DIAGNOSIS — Z8669 Personal history of other diseases of the nervous system and sense organs: Secondary | ICD-10-CM | POA: Diagnosis not present

## 2023-09-16 DIAGNOSIS — F900 Attention-deficit hyperactivity disorder, predominantly inattentive type: Secondary | ICD-10-CM

## 2023-09-16 NOTE — Progress Notes (Signed)
 Subjective:  Patient ID: Bailey Serrano, female    DOB: Sep 25, 2000  Age: 23 y.o. MRN: [id number]  Chief Complaint  Patient presents with   Medical Management of Chronic Issues    HPI Pt with migraines. - she is currently on topamax  50mg  bid for prevention.  She states that this medication has been working well for her but will start tapering dose over the next few months because she is going to try to get pregnant It is recommended that after stopping this medication during first trimester would use only tylenol as needed for migraines and if not helpful would follow OB recommendations  Pt has stopped Pristiq  for depression/anxiety and has been off that medication for several months  Pt with ADD - on Strattera  but will also be decreasing that medication and stopping in a few months     09/16/2023    3:18 PM 03/04/2023    3:12 PM 10/29/2022    3:11 PM 08/28/2022    3:22 PM 05/09/2022    1:59 PM  Depression screen PHQ 2/9  Decreased Interest 0 1 1 0 1  Down, Depressed, Hopeless 0 0 0 0 0  PHQ - 2 Score 0 1 1 0 1  Altered sleeping 0 1 2 1 2   Tired, decreased energy 0 1 1 1 1   Change in appetite 0 0 0 1 1  Feeling bad or failure about yourself  0 0 0 0 0  Trouble concentrating 0 1 0 0 2  Moving slowly or fidgety/restless 0 0 0 0 0  Suicidal thoughts 0 0 0 0 0  PHQ-9 Score 0 4 4 3 7   Difficult doing work/chores Not difficult at all Not difficult at all Somewhat difficult Not difficult at all Somewhat difficult        10/16/2021    3:05 PM 03/14/2022    3:38 PM 05/29/2022    3:22 PM 10/29/2022    3:11 PM 09/16/2023    3:18 PM  Fall Risk  Falls in the past year? 0  0 0 0  Was there an injury with Fall? 0  0 0 0  Fall Risk Category Calculator 0  0 0 0  Fall Risk Category (Retired) Low      (RETIRED) Patient Fall Risk Level Low fall risk Low fall risk     Patient at Risk for Falls Due to No Fall Risks  No Fall Risks No Fall Risks No Fall Risks  Fall risk Follow up Falls evaluation  completed  Falls evaluation completed Falls evaluation completed Falls evaluation completed     ROS CONSTITUTIONAL: Negative for chills, fatigue, fever,   CARDIOVASCULAR: Negative for chest pain, dizziness,  RESPIRATORY: Negative for recent cough and dyspnea.  GASTROINTESTINAL: Negative for abdominal pain, acid reflux symptoms, constipation, diarrhea, nausea and vomiting.  Neuro - see HPI PSYCHIATRIC: see HPI   Current Outpatient Medications:    atomoxetine  (STRATTERA ) 40 MG capsule, Take 1 capsule (40 mg total) by mouth daily., Disp: 90 capsule, Rfl: 1   topiramate  (TOPAMAX ) 50 MG tablet, One oral twice daily, Disp: 180 tablet, Rfl: 1  Past Medical History:  Diagnosis Date   Patellofemoral syndrome    Tendonitis    Objective:  PHYSICAL EXAM:   VS: BP 110/70   Pulse 90   Temp 97.7 F (36.5 C) (Temporal)   Resp 18   Ht 5' 5.5" (1.664 m)   Wt 221 lb 6.4 oz (100.4 kg)   LMP  (LMP Unknown)  SpO2 99%   BMI 36.28 kg/m   GEN: Well nourished, well developed, in no acute distress   Cardiac: RRR; no murmurs, Respiratory:  normal respiratory rate and pattern with no distress - normal breath sounds with no rales, rhonchi, wheezes or rubs  Neuro:  Alert and Oriented x 3,  - CN II-Xii grossly intact Psych: euthymic mood, appropriate affect and demeanor   Assessment & Plan:    History of migraine Continue topamax  at this time and plan to taper Attention deficit hyperactivity disorder (ADHD), predominantly inattentive type Plan to taper strattera  GAD (generalized anxiety disorder) Pt off medication at this time - advised if symptoms recur there are medications safe for pregnancy    Follow-up: Return in about 3 months (around 12/17/2023) for fasting physical - .  An After Visit Summary was printed and given to the patient.  Anthonette Bastos [facility] [phone]

## 2023-12-01 ENCOUNTER — Other Ambulatory Visit: Payer: Self-pay | Admitting: Family Medicine

## 2023-12-01 DIAGNOSIS — Z7689 Persons encountering health services in other specified circumstances: Secondary | ICD-10-CM

## 2023-12-01 DIAGNOSIS — Z8669 Personal history of other diseases of the nervous system and sense organs: Secondary | ICD-10-CM

## 2023-12-03 ENCOUNTER — Other Ambulatory Visit: Payer: Self-pay | Admitting: Physician Assistant

## 2023-12-03 DIAGNOSIS — Z8669 Personal history of other diseases of the nervous system and sense organs: Secondary | ICD-10-CM

## 2023-12-03 DIAGNOSIS — Z7689 Persons encountering health services in other specified circumstances: Secondary | ICD-10-CM

## 2023-12-26 ENCOUNTER — Encounter: Admitting: Physician Assistant

## 2024-01-06 ENCOUNTER — Encounter: Payer: Self-pay | Admitting: Physician Assistant

## 2024-01-06 ENCOUNTER — Ambulatory Visit (INDEPENDENT_AMBULATORY_CARE_PROVIDER_SITE_OTHER): Admitting: Physician Assistant

## 2024-01-06 VITALS — BP 110/60 | HR 108 | Temp 97.3°F | Resp 16 | Ht 65.5 in | Wt 224.0 lb

## 2024-01-06 DIAGNOSIS — Z0000 Encounter for general adult medical examination without abnormal findings: Secondary | ICD-10-CM

## 2024-01-06 DIAGNOSIS — N926 Irregular menstruation, unspecified: Secondary | ICD-10-CM | POA: Diagnosis not present

## 2024-01-06 NOTE — Progress Notes (Signed)
 Subjective:  Patient ID: Bailey Serrano, female    DOB: 06/24/2000  Age: 23 y.o. MRN: [id number]  Chief Complaint  Patient presents with   Annual Exam    HPI Well Adult Physical: Patient here for a comprehensive physical exam.The patient reports no problems - she has stopped her medications because she is trying to get pregnant - she states her LMP was 12/08/23 - was due to start today but has not yet - agreeable for labwork to check pregnancy status Do you take any herbs or supplements that were not prescribed by a doctor? no Are you taking calcium supplements? no Are you taking aspirin daily? no  Encounter for general adult medical examination without abnormal findings  Physical (At Risk items are starred): Patient's last physical exam was 1 year ago .  Patient is not afflicted from Stress Incontinence and Urge Incontinence  Patient wears a seat belts Patient has smoke detectors and has carbon monoxide detectors. Patient wears sunscreen with extended sun exposure. Dental Care: brushes and flosses daily. Last dental visit: up to date Vision impairments: none Ophthalmology/Optometry: due for visit Hearing loss: none  Patient's last menstrual period was 12/08/2023 (exact date). Pregnancy history: G0P0 Safe at home: Yes Self breast exams: Yes Last pap: 12/02/2023 with [facility]      01/06/2024    1:23 PM 09/16/2023    3:18 PM 03/04/2023    3:12 PM 10/29/2022    3:11 PM 08/28/2022    3:22 PM  Depression screen PHQ 2/9  Decreased Interest 0 0 1 1 0  Down, Depressed, Hopeless 0 0 0 0 0  PHQ - 2 Score 0 0 1 1 0  Altered sleeping 0 0 1 2 1   Tired, decreased energy 0 0 1 1 1   Change in appetite 0 0 0 0 1  Feeling bad or failure about yourself  0 0 0 0 0  Trouble concentrating 0 0 1 0 0  Moving slowly or fidgety/restless 0 0 0 0 0  Suicidal thoughts 0 0 0 0 0  PHQ-9 Score 0 0 4 4 3   Difficult doing work/chores Not difficult at all Not difficult at all Not difficult at all  Somewhat difficult Not difficult at all         10/16/2021    3:05 PM 03/14/2022    3:38 PM 05/29/2022    3:22 PM 10/29/2022    3:11 PM 09/16/2023    3:18 PM  Fall Risk  Falls in the past year? 0  0 0 0  Was there an injury with Fall? 0  0 0 0  Fall Risk Category Calculator 0  0 0 0  Fall Risk Category (Retired) Low       (RETIRED) Patient Fall Risk Level Low fall risk  Low fall risk      Patient at Risk for Falls Due to No Fall Risks  No Fall Risks No Fall Risks No Fall Risks  Fall risk Follow up Falls evaluation completed   Falls evaluation completed  Falls evaluation completed Falls evaluation completed     Data saved with a previous flowsheet row definition             Social Hx   Social History   Socioeconomic History   Marital status: Single    Spouse name: Not on file   Number of children: 0   Years of education: 12 + 4   Highest education level: Not on file  Occupational History  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[employer]  Tobacco Use   Smoking status: Never   Smokeless tobacco: Never  Vaping Use   Vaping status: Some Days   Substances: Nicotine, Flavoring  Substance and Sexual Activity   Alcohol use: Not Currently    Comment: rarely   Drug use: Never   Sexual activity: Yes    Partners: Male    Birth control/protection: None  Other Topics Concern   Not on file  Social History Narrative   Not on file   Social Drivers of Health   Financial Resource Strain: Low Risk  (02/26/2022)   Overall Financial Resource Strain (CARDIA)    Difficulty of Paying Living Expenses: Not hard at all  Food Insecurity: No Food Insecurity (02/26/2022)   Hunger Vital Sign    Worried About Running Out of Food in the Last Year: Never true    Ran Out of Food in the Last Year: Never true  Transportation Needs: No Transportation Needs (03/04/2023)   PRAPARE - Administrator, Civil Service (Medical): No    Lack of Transportation (Non-Medical): No  Physical  Activity: Inactive (02/26/2022)   Exercise Vital Sign    Days of Exercise per Week: 0 days    Minutes of Exercise per Session: 0 min  Stress: No Stress Concern Present (02/26/2022)   [employer] of Occupational Health - Occupational Stress Questionnaire    Feeling of Stress : Not at all  Social Connections: Moderately Isolated (02/26/2022)   Social Connection and Isolation Panel    Frequency of Communication with Friends and Family: More than three times a week    Frequency of Social Gatherings with Friends and Family: More than three times a week    Attends Religious Services: Never    Database administrator or Organizations: No    Attends Engineer, structural: Never    Marital Status: Living with partner   Past Medical History:  Diagnosis Date   Patellofemoral syndrome    Tendonitis    Past Surgical History:  Procedure Laterality Date   GALLBLADDER SURGERY      Family History  Adopted: Yes  Problem Relation Age of Onset   Hypertension Mother    Alcohol abuse Mother    Drug abuse Mother    COPD Maternal Grandmother     ROS CONSTITUTIONAL: Negative for chills, fatigue, fever, unintentional weight gain and unintentional weight loss.  E/N/T: Negative for ear pain, nasal congestion and sore throat.  CARDIOVASCULAR: Negative for chest pain, dizziness, palpitations and pedal edema.  RESPIRATORY: Negative for recent cough and dyspnea.  GASTROINTESTINAL: Negative for abdominal pain, acid reflux symptoms, constipation, diarrhea, nausea and vomiting.  MSK: Negative for arthralgias and myalgias.  INTEGUMENTARY: Negative for rash.  NEUROLOGICAL: Negative for dizziness and headaches.  PSYCHIATRIC: Negative for sleep disturbance and to question depression screen.  Negative for depression, negative for anhedonia.   Objective:  PHYSICAL EXAM:   BP 110/60   Pulse (!) 108   Temp (!) 97.3 F (36.3 C)   Resp 16   Ht 5' 5.5 (1.664 m)   Wt 224 lb (101.6 kg)   LMP  12/08/2023 (Exact Date)   SpO2 99%   BMI 36.71 kg/m   Vision Screening   Right eye Left eye Both eyes  Without correction 20/25 20/20 20/20   With correction       GEN: Well nourished, well developed, in no acute distress  HEENT: normal external ears and nose - normal external auditory  canals and TMS - hearing grossly normal - - Lips, Teeth and Gums - normal  Oropharynx - normal mucosa, palate, and posterior pharynx Neck: no JVD or masses - no thyromegaly Cardiac: RRR; no murmurs, rubs, or gallops,no edema - no significant varicosities Respiratory:  normal respiratory rate and pattern with no distress - normal breath sounds with no rales, rhonchi, wheezes or rubs GI: normal bowel sounds, no masses or tenderness MS: no deformity or atrophy  Skin: warm and dry, no rash  Neuro:  Alert and Oriented x 3, Strength and sensation are intact - CN II-Xii grossly intact Psych: euthymic mood, appropriate affect and demeanor  Assessment & Plan:  Annual physical exam -     CBC with Differential/Platelet -     Comprehensive metabolic panel with GFR -     Thyroid  Panel With TSH -     Lipid panel  Missed period -     Beta hCG quant (ref lab)    This is a list of the screening recommended for you and due dates:  Health Maintenance  Topic Date Due   Hepatitis B Vaccine (1 of 3 - 19+ 3-dose series) Never done   Flu Shot  Never done   Pap Smear  09/15/2024*   DTaP/Tdap/Td vaccine (2 - Td or Tdap) 03/03/2033   Pneumococcal Vaccine  Aged Out   Meningitis B Vaccine  Aged Out   HPV Vaccine  Discontinued   COVID-19 Vaccine  Discontinued   Hepatitis C Screening  Discontinued   HIV Screening  Discontinued  *Topic was postponed. The date shown is not the original due date.     Follow-up: Return in about 1 year (around 01/05/2025) for fasting physical.  An After Visit Summary was printed and given to the patient.  CAMIE JONELLE NICHOLAUS DEVONNA [facility] [phone]

## 2024-01-07 ENCOUNTER — Ambulatory Visit: Payer: Self-pay | Admitting: Physician Assistant

## 2024-01-07 LAB — COMPREHENSIVE METABOLIC PANEL WITH GFR
ALT: 32 IU/L (ref 0–32)
AST: 26 IU/L (ref 0–40)
Albumin: 4.2 g/dL (ref 4.0–5.0)
Alkaline Phosphatase: 75 IU/L (ref 44–121)
BUN/Creatinine Ratio: 15 (ref 9–23)
BUN: 10 mg/dL (ref 6–20)
Bilirubin Total: 0.6 mg/dL (ref 0.0–1.2)
CO2: 21 mmol/L (ref 20–29)
Calcium: 9.1 mg/dL (ref 8.7–10.2)
Chloride: 101 mmol/L (ref 96–106)
Creatinine, Ser: 0.66 mg/dL (ref 0.57–1.00)
Globulin, Total: 2.8 g/dL (ref 1.5–4.5)
Glucose: 82 mg/dL (ref 70–99)
Potassium: 4.3 mmol/L (ref 3.5–5.2)
Sodium: 138 mmol/L (ref 134–144)
Total Protein: 7 g/dL (ref 6.0–8.5)
eGFR: 125 mL/min/1.73 (ref >59)

## 2024-01-07 LAB — CBC WITH DIFFERENTIAL/PLATELET
Basophils Absolute: 0.1 x10E3/uL (ref 0.0–0.2)
Basos: 1 %
EOS (ABSOLUTE): 0.1 x10E3/uL (ref 0.0–0.4)
Eos: 1 %
Hematocrit: 45.7 % (ref 34.0–46.6)
Hemoglobin: 14.7 g/dL (ref 11.1–15.9)
Immature Grans (Abs): 0.1 x10E3/uL (ref 0.0–0.1)
Immature Granulocytes: 1 %
Lymphocytes Absolute: 1.5 x10E3/uL (ref 0.7–3.1)
Lymphs: 13 %
MCH: 29.1 pg (ref 26.6–33.0)
MCHC: 32.2 g/dL (ref 31.5–35.7)
MCV: 91 fL (ref 79–97)
Monocytes Absolute: 0.6 x10E3/uL (ref 0.1–0.9)
Monocytes: 5 %
Neutrophils Absolute: 9.1 x10E3/uL — ABNORMAL HIGH (ref 1.4–7.0)
Neutrophils: 79 %
Platelets: 294 x10E3/uL (ref 150–450)
RBC: 5.05 x10E6/uL (ref 3.77–5.28)
RDW: 12.8 % (ref 11.7–15.4)
WBC: 11.5 x10E3/uL — ABNORMAL HIGH (ref 3.4–10.8)

## 2024-01-07 LAB — THYROID PANEL WITH TSH
Free Thyroxine Index: 3.7 (ref 1.2–4.9)
T3 Uptake Ratio: 37 % (ref 24–39)
T4, Total: 10.1 ug/dL (ref 4.5–12.0)
TSH: 1.17 u[IU]/mL (ref 0.450–4.500)

## 2024-01-07 LAB — LIPID PANEL
Chol/HDL Ratio: 4.4 ratio (ref 0.0–4.4)
Cholesterol, Total: 208 mg/dL — ABNORMAL HIGH (ref 100–199)
HDL: 47 mg/dL (ref >39)
LDL Chol Calc (NIH): 137 mg/dL — ABNORMAL HIGH (ref 0–99)
Triglycerides: 133 mg/dL (ref 0–149)
VLDL Cholesterol Cal: 24 mg/dL (ref 5–40)

## 2024-01-07 LAB — BETA HCG QUANT (REF LAB): hCG Quant: <1 m[IU]/mL

## 2024-03-11 ENCOUNTER — Ambulatory Visit: Admitting: Physician Assistant

## 2024-03-11 ENCOUNTER — Encounter: Payer: Self-pay | Admitting: Physician Assistant

## 2024-03-11 VITALS — BP 128/72 | HR 88 | Temp 98.2°F | Ht 65.5 in | Wt 225.0 lb

## 2024-03-11 DIAGNOSIS — K625 Hemorrhage of anus and rectum: Secondary | ICD-10-CM

## 2024-03-11 DIAGNOSIS — N200 Calculus of kidney: Secondary | ICD-10-CM

## 2024-03-11 DIAGNOSIS — K648 Other hemorrhoids: Secondary | ICD-10-CM | POA: Diagnosis not present

## 2024-03-11 NOTE — Progress Notes (Signed)
 Subjective:  Patient ID: Bailey Serrano, female    DOB: [birth year]  Age: 23 y.o. MRN: [id number]  Chief Complaint  Patient presents with   ER follow up    HPI Pt in today after being seen at ED for rectal bleeding - it was found to be caused by internal and external hemorrhoids.  She states she was having significantly moderate bleeding when went to ED - is currently not having any problems She denies constipation, no rectal bleeding or pain Pt would like GI referral for further evaluation  Incidentally reviewing ED records her CT showed small right proximal ureter stone - she is asymptomatic at this time.  Has not had any flank pain.  Unsure about recent hematuria because she is currently have menstrual cycle     01/06/2024    1:23 PM 09/16/2023    3:18 PM 03/04/2023    3:12 PM 10/29/2022    3:11 PM 08/28/2022    3:22 PM  Depression screen PHQ 2/9  Decreased Interest 0 0 1 1 0  Down, Depressed, Hopeless 0 0 0 0 0  PHQ - 2 Score 0 0 1 1 0  Altered sleeping 0 0 1 2 1   Tired, decreased energy 0 0 1 1 1   Change in appetite 0 0 0 0 1  Feeling bad or failure about yourself  0 0 0 0 0  Trouble concentrating 0 0 1 0 0  Moving slowly or fidgety/restless 0 0 0 0 0  Suicidal thoughts 0 0 0 0 0  PHQ-9 Score 0  0  4  4  3    Difficult doing work/chores Not difficult at all Not difficult at all Not difficult at all Somewhat difficult Not difficult at all     Data saved with a previous flowsheet row definition        10/16/2021    3:05 PM 03/14/2022    3:38 PM 05/29/2022    3:22 PM 10/29/2022    3:11 PM 09/16/2023    3:18 PM  Fall Risk  Falls in the past year? 0  0 0 0  Was there an injury with Fall? 0  0 0 0  Fall Risk Category Calculator 0  0 0 0  Fall Risk Category (Retired) Low       (RETIRED) Patient Fall Risk Level Low fall risk  Low fall risk      Patient at Risk for Falls Due to No Fall Risks  No Fall Risks No Fall Risks No Fall Risks  Fall risk Follow up Falls evaluation  completed   Falls evaluation completed  Falls evaluation completed Falls evaluation completed     Data saved with a previous flowsheet row definition     ROS CONSTITUTIONAL: Negative for chills, fatigue, fever,    CARDIOVASCULAR: Negative for chest pain,  RESPIRATORY: Negative for recent cough and dyspnea.  GASTROINTESTINAL: see HPI GU - negative PSYCHIATRIC: Negative for sleep disturbance and to question depression screen.  Negative for depression, negative for anhedonia.    Current Outpatient Medications:    docusate sodium (COLACE) 100 MG capsule, Take 100 mg by mouth 2 (two) times daily., Disp: , Rfl:    Prenatal Vit-Fe Fumarate-FA (PRENATAL MULTIVITAMIN) TABS tablet, Take 1 tablet by mouth daily at 12 noon., Disp: , Rfl:   Past Medical History:  Diagnosis Date   Patellofemoral syndrome    Tendonitis    Objective:  PHYSICAL EXAM:   BP 128/72 (BP Location: Left Arm, Patient Position: Sitting)  Pulse 88   Temp 98.2 F (36.8 C) (Temporal)   Ht 5' 5.5 (1.664 m)   Wt 225 lb (102.1 kg)   SpO2 99%   BMI 36.87 kg/m    GEN: Well nourished, well developed, in no acute distress  Cardiac: RRR; no murmurs, rubs, or gallops,no edema - Respiratory:  normal respiratory rate and pattern with no distress - normal breath sounds with no rales, rhonchi, wheezes or rubs GI: normal bowel sounds, no masses or tenderness Psych: euthymic mood, appropriate affect and demeanor  Assessment & Plan:    Other hemorrhoids -     Ambulatory referral to Gastroenterology  Rectal bleeding -     Ambulatory referral to Gastroenterology  Right kidney stone Recommend hydrate well --- should pass on its own Follow up if pain , nausea or vomiting starts    Follow-up: Return in about 6 weeks (around 04/22/2024), or if symptoms worsen or fail to improve, for follow-up.  An After Visit Summary was printed and given to the patient.  CAMIE JONELLE NICHOLAUS DEVONNA [facility] [phone]

## 2024-03-26 ENCOUNTER — Encounter (HOSPITAL_BASED_OUTPATIENT_CLINIC_OR_DEPARTMENT_OTHER): Payer: Self-pay | Admitting: Family Medicine

## 2024-03-26 ENCOUNTER — Encounter: Payer: Self-pay | Admitting: Physician Assistant

## 2024-03-26 ENCOUNTER — Telehealth (INDEPENDENT_AMBULATORY_CARE_PROVIDER_SITE_OTHER): Admitting: Family Medicine

## 2024-03-26 ENCOUNTER — Ambulatory Visit: Payer: Self-pay

## 2024-03-26 ENCOUNTER — Encounter (HOSPITAL_BASED_OUTPATIENT_CLINIC_OR_DEPARTMENT_OTHER): Payer: Self-pay | Admitting: *Deleted

## 2024-03-26 DIAGNOSIS — K648 Other hemorrhoids: Secondary | ICD-10-CM

## 2024-03-26 DIAGNOSIS — K644 Residual hemorrhoidal skin tags: Secondary | ICD-10-CM

## 2024-03-26 MED ORDER — HYDROCORTISONE ACETATE 25 MG RE SUPP: 25.0000 mg | Freq: Every day | RECTAL | 1 refills | Status: DC

## 2024-03-26 MED ORDER — HYDROCORTISONE (PERIANAL) 2.5 % EX CREA: 1.0000 | TOPICAL_CREAM | Freq: Two times a day (BID) | CUTANEOUS | 2 refills | Status: DC

## 2024-03-26 NOTE — Telephone Encounter (Signed)
 FYI Only or Action Required?: Action required by provider: Pt has scheduled a vv for this afternoon. Pt needs a note for work. If bleeding has stopped and PCP is able to write a note for work, pt will cancel appt for today..   Patient was last seen in primary care on 03/11/2024 by Nicholaus Credit, PA-C.  Called Nurse Triage reporting Rectal Bleeding.  Symptoms began yesterday.  Interventions attempted: Rest, hydration, or home remedies. Pt is using all home advice. Pt did d/c stool softeners.  Symptoms are: stable.  Triage Disposition: See Physician Within 24 Hours  Patient/caregiver understands and will follow disposition?: Yes                        Copied from CRM #[id number]. Topic: Clinical - Red Word Triage >> Mar 26, 2024  7:39 AM Delon HERO wrote: Red Word that prompted transfer to Nurse Triage: Patient is calling rectal bleeding with pain. And abdominal tightness GI appt on May 12, 2023.  Previous office visit with PCP on 03/11/2024. Called out from work.  Requesting letter since the patient called out from work. Since there is a previous history and have discussed with Ginnie. Reason for Disposition  MODERATE rectal bleeding (e.g., small blood clots, passing blood without stool, or toilet water turns red)  Answer Assessment - Initial Assessment Questions 1. APPEARANCE of BLOOD: What color is it? Is it passed separately, on the surface of the stool, or mixed in with the stool?      Blood in water of toilet 2. AMOUNT: How much blood was passed?      Fair amount - water is red 3. FREQUENCY: How many times has blood been passed with the stools?      2 times 4. ONSET: When was the blood first seen in the stools? (Days or weeks)      Middle of October was first event. Today is the 2nd 5. DIARRHEA: Is there also some diarrhea? If Yes, ask: How many diarrhea stools in the past 24 hours?      no 6. CONSTIPATION: Do you have constipation? If Yes,  ask: How bad is it?     no 7. RECURRENT SYMPTOMS: Have you had blood in your stools before? If Yes, ask: When was the last time? and What happened that time?      Yes a few weeks back 8. BLOOD THINNERS: Do you take any blood thinners? (e.g., aspirin, clopidogrel / Plavix, coumadin, heparin). Notes: Other strong blood thinners include: Arixtra (fondaparinux), Eliquis (apixaban), Pradaxa (dabigatran), and Xarelto (rivaroxaban).     no 9. OTHER SYMPTOMS: Do you have any other symptoms?  (e.g., abdomen pain, vomiting, dizziness, fever)     Hemorrhoids  Protocols used: Rectal Bleeding-A-AH

## 2024-03-29 NOTE — Progress Notes (Signed)
 Patient on my schedule for a televisit.  Unable to hear her, and converted this to a phone call.  She is struggling with hemorrhoids and known to Camie Moats, [place].  We reviewed strategies to keep her stools soft and regular in some detail.  I called in Anusol  HC cream bid prn.  Also called in Anusol  HC suppository at hs.  I suggested she see Dr. Joesph of surgery in the coming weeks if she struggles.  Hygiene also reviewed with tucks pads.  F/u in the office prn.

## 2024-04-13 ENCOUNTER — Ambulatory Visit: Admitting: Physician Assistant

## 2024-04-22 ENCOUNTER — Encounter: Payer: Self-pay | Admitting: Physician Assistant

## 2024-04-22 ENCOUNTER — Ambulatory Visit: Admitting: Physician Assistant

## 2024-04-22 VITALS — BP 110/76 | HR 102 | Temp 98.0°F | Resp 18 | Ht 65.5 in | Wt 226.4 lb

## 2024-04-22 DIAGNOSIS — K625 Hemorrhage of anus and rectum: Secondary | ICD-10-CM

## 2024-04-22 DIAGNOSIS — R197 Diarrhea, unspecified: Secondary | ICD-10-CM

## 2024-04-22 LAB — POCT URINALYSIS DIP (CLINITEK)
Bilirubin, UA: NEGATIVE
Blood, UA: NEGATIVE
Glucose, UA: NEGATIVE mg/dL
Ketones, POC UA: NEGATIVE mg/dL
Leukocytes, UA: NEGATIVE
Nitrite, UA: NEGATIVE
POC PROTEIN,UA: 30 — AB
Spec Grav, UA: 1.025 (ref 1.010–1.025)
Urobilinogen, UA: 0.2 U/dL
pH, UA: 6 (ref 5.0–8.0)

## 2024-04-22 NOTE — Progress Notes (Signed)
 Subjective:  Patient ID: Bailey Serrano, female    DOB: Jun 16, 2000  Age: 23 y.o. MRN: [id number]  Chief Complaint  Patient presents with   Hemorrhoids    Follow Up    HPI Pt in today for follow up of rectal bleeding - she states since last being seen she has had another episode of bright red rectal bleeding - describes as a lot in toilet and lasts for 1 - 1 and 1/2 days She does have appt with GI on 05/11/24 for further evaluation She states for the past few weeks she has been having very loose stools and green at times - denies fever or abdominal pain  At last visit we had discussed abdominal/pelvic CT that was in her chart - it showed a small distal stone however when pulling report today her imaging did not show those results --- her CT was actually normal and she had another patient's CT results scanned in her chart      01/06/2024    1:23 PM 09/16/2023    3:18 PM 03/04/2023    3:12 PM 10/29/2022    3:11 PM 08/28/2022    3:22 PM  Depression screen PHQ 2/9  Decreased Interest 0 0 1 1 0  Down, Depressed, Hopeless 0 0 0 0 0  PHQ - 2 Score 0 0 1 1 0  Altered sleeping 0 0 1 2 1   Tired, decreased energy 0 0 1 1 1   Change in appetite 0 0 0 0 1  Feeling bad or failure about yourself  0 0 0 0 0  Trouble concentrating 0 0 1 0 0  Moving slowly or fidgety/restless 0 0 0 0 0  Suicidal thoughts 0 0 0 0 0  PHQ-9 Score 0  0  4  4  3    Difficult doing work/chores Not difficult at all Not difficult at all Not difficult at all Somewhat difficult Not difficult at all     Data saved with a previous flowsheet row definition        10/16/2021    3:05 PM 03/14/2022    3:38 PM 05/29/2022    3:22 PM 10/29/2022    3:11 PM 09/16/2023    3:18 PM  Fall Risk  Falls in the past year? 0  0 0 0  Was there an injury with Fall? 0   0  0  0   Fall Risk Category Calculator 0  0 0 0  Fall Risk Category (Retired) Low       (RETIRED) Patient Fall Risk Level Low fall risk  Low fall risk      Patient at Risk for  Falls Due to No Fall Risks  No Fall Risks No Fall Risks No Fall Risks  Fall risk Follow up Falls evaluation completed   Falls evaluation completed  Falls evaluation completed Falls evaluation completed     Data saved with a previous flowsheet row definition     ROS CONSTITUTIONAL: Negative for chills, fatigue, fever, CARDIOVASCULAR: Negative for chest pain, dizziness,  RESPIRATORY: Negative for recent cough and dyspnea.  GASTROINTESTINAL: see HPI   Current Medications[1]  Past Medical History:  Diagnosis Date   Patellofemoral syndrome    Tendonitis    Objective:  PHYSICAL EXAM:   BP 110/76   Pulse (!) 102   Temp 98 F (36.7 C) (Temporal)   Resp 18   Ht 5' 5.5 (1.664 m)   Wt 226 lb 6.4 oz (102.7 kg)   LMP 03/08/2024 (Exact  Date)   SpO2 98%   BMI 37.10 kg/m    GEN: Well nourished, well developed, in no acute distress  Cardiac: RRR; no murmurs, rubs, or gallops,no edema - Respiratory:  normal respiratory rate and pattern with no distress - normal breath sounds with no rales, rhonchi, wheezes or rubs Skin: warm and dry, no rash    Office Visit on 04/22/2024  Component Date Value Ref Range Status   Color, UA 04/22/2024 yellow  yellow Final   Clarity, UA 04/22/2024 clear  clear Final   Glucose, UA 04/22/2024 negative  negative mg/dL Final   Bilirubin, UA 87/81/7974 negative  negative Final   Ketones, POC UA 04/22/2024 negative  negative mg/dL Final   Spec Grav, UA 87/81/7974 1.025  1.010 - 1.025 Final   Blood, UA 04/22/2024 negative  negative Final   pH, UA 04/22/2024 6.0  5.0 - 8.0 Final   POC PROTEIN,UA 04/22/2024 =30 (A)  negative, trace Final   Urobilinogen, UA 04/22/2024 0.2  0.2 or 1.0 E.U./dL Final   Nitrite, UA 87/81/7974 Negative  Negative Final   Leukocytes, UA 04/22/2024 Negative  Negative Final    Assessment & Plan:    Rectal bleeding -     Cdiff NAA+O+P+Stool Culture -     POCT URINALYSIS DIP (CLINITEK) Follow up with GI as scheduled Diarrhea,  unspecified type -     Cdiff NAA+O+P+Stool Culture -     POCT URINALYSIS DIP (CLINITEK)     Follow-up: Return if symptoms worsen or fail to improve.  An After Visit Summary was printed and given to the patient.  SARA R Michael Ventresca, PA-C [facility] [phone]    [1]  Current Outpatient Medications:    docusate sodium (COLACE) 100 MG capsule, Take 100 mg by mouth 2 (two) times daily., Disp: , Rfl:    hydrocortisone  (ANUSOL -HC) 2.5 % rectal cream, Place 1 Application rectally 2 (two) times daily., Disp: 30 g, Rfl: 2   hydrocortisone  (ANUSOL -HC) 25 MG suppository, Place 1 suppository (25 mg total) rectally at bedtime., Disp: 7 suppository, Rfl: 1   Prenatal Vit-Fe Fumarate-FA (PRENATAL MULTIVITAMIN) TABS tablet, Take 1 tablet by mouth daily at 12 noon., Disp: , Rfl:

## 2024-04-26 ENCOUNTER — Encounter: Payer: Self-pay | Admitting: Physician Assistant

## 2024-04-30 ENCOUNTER — Ambulatory Visit: Payer: Self-pay | Admitting: Physician Assistant

## 2024-04-30 LAB — CDIFF NAA+O+P+STOOL CULTURE
E coli, Shiga toxin Assay: NEGATIVE
Toxigenic C. Difficile by PCR: NEGATIVE

## 2024-05-11 ENCOUNTER — Ambulatory Visit: Payer: Self-pay | Admitting: Gastroenterology

## 2024-05-11 ENCOUNTER — Encounter: Payer: Self-pay | Admitting: Gastroenterology

## 2024-05-11 ENCOUNTER — Other Ambulatory Visit (INDEPENDENT_AMBULATORY_CARE_PROVIDER_SITE_OTHER)

## 2024-05-11 ENCOUNTER — Ambulatory Visit (INDEPENDENT_AMBULATORY_CARE_PROVIDER_SITE_OTHER)
Admission: RE | Admit: 2024-05-11 | Discharge: 2024-05-11 | Disposition: A | Source: Ambulatory Visit | Attending: Gastroenterology

## 2024-05-11 ENCOUNTER — Ambulatory Visit: Admitting: Gastroenterology

## 2024-05-11 VITALS — BP 110/70 | HR 108 | Ht 65.75 in | Wt 227.2 lb

## 2024-05-11 DIAGNOSIS — R12 Heartburn: Secondary | ICD-10-CM | POA: Diagnosis not present

## 2024-05-11 DIAGNOSIS — R194 Change in bowel habit: Secondary | ICD-10-CM

## 2024-05-11 DIAGNOSIS — K625 Hemorrhage of anus and rectum: Secondary | ICD-10-CM | POA: Diagnosis not present

## 2024-05-11 DIAGNOSIS — R109 Unspecified abdominal pain: Secondary | ICD-10-CM

## 2024-05-11 DIAGNOSIS — R152 Fecal urgency: Secondary | ICD-10-CM | POA: Diagnosis not present

## 2024-05-11 LAB — CBC WITH DIFFERENTIAL/PLATELET
Basophils Absolute: 0.1 K/uL (ref 0.0–0.1)
Basophils Relative: 1.1 % (ref 0.0–3.0)
Eosinophils Absolute: 0.1 K/uL (ref 0.0–0.7)
Eosinophils Relative: 0.7 % (ref 0.0–5.0)
HCT: 42.9 % (ref 36.0–46.0)
Hemoglobin: 14.4 g/dL (ref 12.0–15.0)
Lymphocytes Relative: 18.9 % (ref 12.0–46.0)
Lymphs Abs: 1.7 K/uL (ref 0.7–4.0)
MCHC: 33.6 g/dL (ref 30.0–36.0)
MCV: 85.3 fl (ref 78.0–100.0)
Monocytes Absolute: 0.4 K/uL (ref 0.1–1.0)
Monocytes Relative: 4.5 % (ref 3.0–12.0)
Neutro Abs: 6.9 K/uL (ref 1.4–7.7)
Neutrophils Relative %: 74.8 % (ref 43.0–77.0)
Platelets: 315 K/uL (ref 150.0–400.0)
RBC: 5.03 Mil/uL (ref 3.87–5.11)
RDW: 13.1 % (ref 11.5–15.5)
WBC: 9.2 K/uL (ref 4.0–10.5)

## 2024-05-11 LAB — COMPREHENSIVE METABOLIC PANEL WITH GFR
ALT: 29 U/L (ref 3–35)
AST: 20 U/L (ref 5–37)
Albumin: 4.3 g/dL (ref 3.5–5.2)
Alkaline Phosphatase: 57 U/L (ref 39–117)
BUN: 12 mg/dL (ref 6–23)
CO2: 26 meq/L (ref 19–32)
Calcium: 8.9 mg/dL (ref 8.4–10.5)
Chloride: 104 meq/L (ref 96–112)
Creatinine, Ser: 0.78 mg/dL (ref 0.40–1.20)
GFR: 105.6 mL/min
Glucose, Bld: 91 mg/dL (ref 70–99)
Potassium: 4 meq/L (ref 3.5–5.1)
Sodium: 138 meq/L (ref 135–145)
Total Bilirubin: 0.3 mg/dL (ref 0.2–1.2)
Total Protein: 7.2 g/dL (ref 6.0–8.3)

## 2024-05-11 LAB — C-REACTIVE PROTEIN: CRP: 0.8 mg/dL — ABNORMAL LOW (ref 1.0–20.0)

## 2024-05-11 MED ORDER — NA SULFATE-K SULFATE-MG SULF 17.5-3.13-1.6 GM/177ML PO SOLN: 1.0000 | Freq: Once | ORAL | 0 refills | Status: AC

## 2024-05-11 NOTE — Patient Instructions (Addendum)
 GERD  Recommend GERD diet, no late meals 3-4 hours before lying down OTC Pepcid prn as needed heartburn  Diarrhea Avoid dairy, sugar, or caffeine  Recommend low fodmap diet  Drink plenty of fluids to stay hydrated  We have sent the following medications to your pharmacy for you to pick up at your convenience: SUPREP  Your provider has requested that you go to the basement level for lab work before leaving today. Press B on the elevator. The lab is located at the first door on the left as you exit the elevator.  Your provider has requested that you have an abdominal x ray before leaving today. Please go to the basement floor to our Radiology department for the test.   You have been scheduled for a colonoscopy. Please follow written instructions given to you at your visit today.   If you use inhalers (even only as needed), please bring them with you on the day of your procedure.  DO NOT TAKE 7 DAYS PRIOR TO TEST- Trulicity (dulaglutide) Ozempic , Wegovy  (semaglutide ) Mounjaro, Zepbound (tirzepatide) Bydureon Bcise (exanatide extended release)  DO NOT TAKE 1 DAY PRIOR TO YOUR TEST Rybelsus  (semaglutide ) Adlyxin (lixisenatide) Victoza (liraglutide) Byetta (exanatide) ___________________________________________________________________________  Due to recent changes in healthcare laws, you may see the results of your imaging and laboratory studies on MyChart before your provider has had a chance to review them.  We understand that in some cases there may be results that are confusing or concerning to you. Not all laboratory results come back in the same time frame and the provider may be waiting for multiple results in order to interpret others.  Please give us  48 hours in order for your provider to thoroughly review all the results before contacting the office for clarification of your results.   _______________________________________________________  If your blood pressure at your  visit was 140/90 or greater, please contact your primary care physician to follow up on this.  _______________________________________________________  If you are age 26 or older, your body mass index should be between 23-30. Your Body mass index is 36.96 kg/m. If this is out of the aforementioned range listed, please consider follow up with your Primary Care Provider.  If you are age 42 or younger, your body mass index should be between 19-25. Your Body mass index is 36.96 kg/m. If this is out of the aformentioned range listed, please consider follow up with your Primary Care Provider.   ________________________________________________________  The [facility] providers would like to encourage you to use MYCHART to communicate with providers for non-urgent requests or questions.  Due to long hold times on the telephone, sending your provider a message by [facility] may be a faster and more efficient way to get a response.  Please allow 48 business hours for a response.  Please remember that this is for non-urgent requests.  _______________________________________________________  [facility] is using a team-based approach to care.  Your team is made up of your doctor and two to three APPS. Our APPS (Nurse Practitioners and Physician Assistants) work with your physician to ensure care continuity for you. They are fully qualified to address your health concerns and develop a treatment plan. They communicate directly with your gastroenterologist to care for you. Seeing the Advanced Practice Practitioners on your physician's team can help you by facilitating care more promptly, often allowing for earlier appointments, access to diagnostic testing, procedures, and other specialty referrals.   Thank you for trusting me with your gastrointestinal care. Deanna May,  FNP-C

## 2024-05-11 NOTE — Progress Notes (Signed)
 "  Chief Complaint:hemorrhoids, rectal bleeding Primary GI Doctor: Dr. Charlanne   HPI:  Patient is a  24  year old female patient with  past medical history of laparoscopic cholecystectomy who was referred to me by Nicholaus Credit, PA-C on 03/11/24 for a evaluation of hemorrhoids, rectal bleeding .    Interval History Patient presents for evaluation of rectal bleeding, abdominal pain, and diarrhea.  Accompanied by her mother.   Patient reports she woke up one morning in late October and she had urge to have bowel movement and passed blood. She continued to have rectal bleeding throughout the day. She went to ED and they did CT scan, lab work and physical exam. She was told she had constipation and given stool softeners. She reports the stool softeners gave her diarrhea so she stopped them. She continued with diarrhea for over a month. She was given rectal suppositories for hemorrhoids and she used for a few days with no improvement. Denies history of constipation. She has continued with intermittent rectal bleeding since then. She reports last episode of rectal bleeding was November 21st.  She has BM 2-3 per day with urgency. She has abdominal discomfort after every meal.  Appetite good. No weight loss. No exposure. No recent travel.   She reports she is at point where she doesn't eat at work because of the GI distress it causes.  No known food triggers.   Not on any prescribed medication. No herbal supplements/  Uses NSAID or tylenol very seldom.   She reports reflux and regurgitation last few weeks, which is new.   She vapes nicotine daily. She drinks socially.   Surgical history: lap chole  Patient's family history includes: no colon CA, no esophageal CA, no IBD, does not know her fathers side. Her non biological father has ulcerative colitis.   Wt Readings from Last 3 Encounters:  05/11/24 227 lb 4 oz (103.1 kg)  04/22/24 226 lb 6.4 oz (102.7 kg)  03/11/24 225 lb (102.1 kg)    Past  Medical History:  Diagnosis Date   ADHD    Anxiety    Depression    Gallbladder disease    GERD (gastroesophageal reflux disease)    Patellofemoral syndrome    Tendonitis     Past Surgical History:  Procedure Laterality Date   GALLBLADDER SURGERY      Current Outpatient Medications  Medication Sig Dispense Refill   Na Sulfate-K Sulfate-Mg Sulfate concentrate (SUPREP) 17.5-3.13-1.6 GM/177ML SOLN Take 1 kit (354 mLs total) by mouth once for 1 dose. 354 mL 0   No current facility-administered medications for this visit.    Allergies as of 05/11/2024 - Review Complete 05/11/2024  Allergen Reaction Noted   Lexapro  [escitalopram ] Nausea Only 04/11/2022    Family History  Adopted: Yes  Problem Relation Age of Onset   Hypertension Mother    Alcohol abuse Mother    Drug abuse Mother    COPD Maternal Grandmother    Hyperlipidemia Maternal Grandmother    Skin cancer Maternal Grandmother    Hypertension Maternal Grandmother    Hyperlipidemia Maternal Aunt    Hypertension Maternal Aunt     Review of Systems:    Constitutional: No weight loss, fever, chills, weakness or fatigue HEENT: Eyes: No change in vision               Ears, Nose, Throat:  No change in hearing or congestion Skin: No rash or itching Cardiovascular: No chest pain, chest pressure or palpitations  Respiratory: No SOB or cough Gastrointestinal: See HPI and otherwise negative Genitourinary: No dysuria or change in urinary frequency Neurological: No headache, dizziness or syncope Musculoskeletal: No new muscle or joint pain Hematologic: No bleeding or bruising Psychiatric: No history of depression or anxiety    Physical Exam:  Vital signs: BP 110/70 (BP Location: Left Arm, Patient Position: Sitting)   Pulse (!) 108   Ht 5' 5.75 (1.67 m) Comment: height measured without shoes  Wt 227 lb 4 oz (103.1 kg)   LMP 05/04/2024   BMI 36.96 kg/m   Constitutional:   Pleasant  female appears to be in NAD, Well  developed, Well nourished, alert and cooperative Eyes:   PEERL, EOMI. No icterus. Conjunctiva pink. Neck:  Supple Throat: Oral cavity and pharynx without inflammation, swelling or lesion.  Respiratory: Respirations even and unlabored. Lungs clear to auscultation bilaterally.   No wheezes, crackles, or rhonchi.  Cardiovascular: Normal S1, S2. Regular rate and rhythm. No peripheral edema, cyanosis or pallor.  Gastrointestinal:  Soft, nondistended, nontender. No rebound or guarding. Normal bowel sounds. No appreciable masses or hepatomegaly. Rectal:  Not performed. Declined  Msk:  Symmetrical without gross deformities. Without edema, no deformity or joint abnormality.  Neurologic:  Alert and  oriented x4;  grossly normal neurologically.  Skin:   Dry and intact without significant lesions or rashes.  RELEVANT LABS AND IMAGING: CBC    Latest Ref Rng & Units 01/06/2024    1:49 PM 03/14/2022   12:00 AM 02/26/2022    3:57 PM  CBC  WBC 3.4 - 10.8 x10E3/uL 11.5  11.1     12.3   Hemoglobin 11.1 - 15.9 g/dL 85.2  85.4     84.5   Hematocrit 34.0 - 46.6 % 45.7  43     45.4   Platelets 150 - 450 x10E3/uL 294  325     343      This result is from an external source.     CMP     Latest Ref Rng & Units 01/06/2024    1:49 PM 03/14/2022    2:57 PM 02/26/2022    3:57 PM  CMP  Glucose 70 - 99 mg/dL 82  97  64   BUN 6 - 20 mg/dL 10  10  11    Creatinine 0.57 - 1.00 mg/dL 9.33  9.24  9.24   Sodium 134 - 144 mmol/L 138  137  140   Potassium 3.5 - 5.2 mmol/L 4.3  3.5  4.3   Chloride 96 - 106 mmol/L 101  106  101   CO2 20 - 29 mmol/L 21  24  25    Calcium 8.7 - 10.2 mg/dL 9.1  9.0  9.8   Total Protein 6.0 - 8.5 g/dL 7.0  7.4  7.2   Total Bilirubin 0.0 - 1.2 mg/dL 0.6  0.8  0.4   Alkaline Phos 44 - 121 IU/L 75  53  71   AST 0 - 40 IU/L 26  24  17    ALT 0 - 32 IU/L 32  27  20      Lab Results  Component Value Date   TSH 1.170 01/06/2024   04/22/24 stool studies negative  06/2016 EGD with  [facility] Impression: - Normal esophagus. Biopsied.                     - Normal stomach. Biopsied.                     -  Normal examined duodenum. Biopsied.    Assessment: Encounter Diagnoses  Name Primary?   Rectal bleeding Yes   Rectal urgency    Altered bowel habits    Abdominal discomfort    Heartburn   24 year old female patient with hx of lap chole (2018) that presents with rectal bleeding, urgent diarrhea, and abdominal pain intermittently for the past 3 months. Stool studies negative.  Reports negative CT scan however did not have report with me.  Will go ahead and order abdominal x-ray two-view downstairs to rule out obstipation.  Request imaging for review.  Will go ahead and check lab work to rule out celiac disease or inflammatory disease.  Patient declined physical examination today would like to go ahead and proceed with colonoscopy as she seems very anxious that she Nymir Ringler have IBD. Will schedule in [facility] with Dr. Charlanne.  Patient also reports new onset GERD for the last couple of weeks.  Patient can use Pepcid as needed along with education on strict GERD diet. EGD 06/2016 normal.   Plan: - request imaging from [facility]  -abdominal xray 2 view r/o obstipation -Check CBC, CMP, CRP, TTG IgA, IgA -Schedule for a colonoscopy in [facility] with Dr. Charlanne. The risks and benefits of colonoscopy with possible polypectomy / biopsies were discussed and the patient agrees to proceed.    Thank you for the courtesy of this consult. Please call me with any questions or concerns.   Emeka Lindner, FNP-C [facility] 05/11/2024, 9:51 AM  Cc: Nicholaus Credit, PA-C  "

## 2024-05-12 LAB — IGA: Immunoglobulin A: 237 mg/dL (ref 47–310)

## 2024-05-12 LAB — TISSUE TRANSGLUTAMINASE ABS,IGG,IGA
(tTG) Ab, IgA: <1 U/mL
(tTG) Ab, IgG: <1 U/mL

## 2024-06-11 ENCOUNTER — Encounter: Payer: Self-pay | Admitting: Gastroenterology

## 2024-06-18 ENCOUNTER — Encounter: Admitting: Gastroenterology

## 2025-01-06 ENCOUNTER — Encounter: Admitting: Physician Assistant
